# Patient Record
Sex: Male | Born: 1935 | Race: White | Hispanic: No | Marital: Married | Smoking: Former smoker
Health system: Southern US, Community
[De-identification: ages and names within clinical notes are randomized; demographics above are authoritative.]

## PROBLEM LIST (undated history)

## (undated) DIAGNOSIS — C61 Malignant neoplasm of prostate: Secondary | ICD-10-CM

## (undated) DIAGNOSIS — I10 Essential (primary) hypertension: Secondary | ICD-10-CM

## (undated) DIAGNOSIS — E46 Unspecified protein-calorie malnutrition: Secondary | ICD-10-CM

## (undated) DIAGNOSIS — G20 Parkinson's disease: Secondary | ICD-10-CM

## (undated) DIAGNOSIS — E785 Hyperlipidemia, unspecified: Secondary | ICD-10-CM

## (undated) DIAGNOSIS — N189 Chronic kidney disease, unspecified: Secondary | ICD-10-CM

## (undated) HISTORY — PX: AORTIC VALVE REPLACEMENT: SHX41

---

## 2012-08-09 ENCOUNTER — Other Ambulatory Visit: Payer: Self-pay | Admitting: Internal Medicine

## 2013-02-12 ENCOUNTER — Other Ambulatory Visit: Payer: Self-pay | Admitting: Internal Medicine

## 2013-02-28 ENCOUNTER — Ambulatory Visit (HOSPITAL_COMMUNITY)
Admission: AD | Admit: 2013-02-28 | Discharge: 2013-02-28 | Disposition: A | Discharge status: Home / Self Care | Payer: Medicare Other | Source: Other Acute Inpatient Hospital | Attending: Internal Medicine | Admitting: Internal Medicine

## 2013-02-28 ENCOUNTER — Inpatient Hospital Stay
Admission: AD | Admit: 2013-02-28 | Discharge: 2013-03-21 | Disposition: A | Discharge status: Skilled Nursing Facility | Payer: Medicare Other | Source: Ambulatory Visit | Attending: Internal Medicine | Admitting: Internal Medicine

## 2013-02-28 DIAGNOSIS — R0989 Other specified symptoms and signs involving the circulatory and respiratory systems: Secondary | ICD-10-CM | POA: Insufficient documentation

## 2013-02-28 DIAGNOSIS — R0609 Other forms of dyspnea: Secondary | ICD-10-CM | POA: Insufficient documentation

## 2013-02-28 DIAGNOSIS — R092 Respiratory arrest: Secondary | ICD-10-CM | POA: Insufficient documentation

## 2013-02-28 DIAGNOSIS — J189 Pneumonia, unspecified organism: Secondary | ICD-10-CM | POA: Insufficient documentation

## 2013-02-28 HISTORY — DX: Chronic kidney disease, unspecified: N18.9

## 2013-02-28 HISTORY — DX: Essential (primary) hypertension: I10

## 2013-02-28 HISTORY — DX: Hyperlipidemia, unspecified: E78.5

## 2013-02-28 HISTORY — DX: Parkinson's disease: G20

## 2013-02-28 HISTORY — DX: Unspecified protein-calorie malnutrition: E46

## 2013-02-28 HISTORY — DX: Malignant neoplasm of prostate: C61

## 2013-03-01 ENCOUNTER — Other Ambulatory Visit (HOSPITAL_COMMUNITY): Payer: Medicare Other

## 2013-03-01 LAB — CBC WITH DIFFERENTIAL/PLATELET
Basophils Absolute: 0 10*3/uL (ref 0.0–0.1)
Basophils Relative: 0 % (ref 0–1)
EOS ABS: 0 10*3/uL (ref 0.0–0.7)
EOS PCT: 0 % (ref 0–5)
HCT: 31.2 % — ABNORMAL LOW (ref 39.0–52.0)
HEMOGLOBIN: 10.7 g/dL — AB (ref 13.0–17.0)
LYMPHS ABS: 0.5 10*3/uL — AB (ref 0.7–4.0)
Lymphocytes Relative: 5 % — ABNORMAL LOW (ref 12–46)
MCH: 32.5 pg (ref 26.0–34.0)
MCHC: 34.3 g/dL (ref 30.0–36.0)
MCV: 94.8 fL (ref 78.0–100.0)
MONOS PCT: 4 % (ref 3–12)
Monocytes Absolute: 0.3 10*3/uL (ref 0.1–1.0)
Neutro Abs: 7.9 10*3/uL — ABNORMAL HIGH (ref 1.7–7.7)
Neutrophils Relative %: 91 % — ABNORMAL HIGH (ref 43–77)
Platelets: 108 10*3/uL — ABNORMAL LOW (ref 150–400)
RBC: 3.29 MIL/uL — AB (ref 4.22–5.81)
RDW: 13.8 % (ref 11.5–15.5)
WBC: 8.5 10*3/uL (ref 4.0–10.5)

## 2013-03-01 LAB — PREALBUMIN: Prealbumin: 33.6 mg/dL (ref 17.0–34.0)

## 2013-03-01 LAB — COMPREHENSIVE METABOLIC PANEL
ALT: <5 U/L (ref 0–53)
AST: 14 U/L (ref 0–37)
Albumin: 2.5 g/dL — ABNORMAL LOW (ref 3.5–5.2)
Alkaline Phosphatase: 104 U/L (ref 39–117)
BUN: 94 mg/dL — ABNORMAL HIGH (ref 6–23)
CALCIUM: 7.8 mg/dL — AB (ref 8.4–10.5)
CO2: 24 meq/L (ref 19–32)
Chloride: 106 mEq/L (ref 96–112)
Creatinine, Ser: 4.63 mg/dL — ABNORMAL HIGH (ref 0.50–1.35)
GFR, EST AFRICAN AMERICAN: 13 mL/min — AB (ref >90)
GFR, EST NON AFRICAN AMERICAN: 11 mL/min — AB (ref >90)
GLUCOSE: 108 mg/dL — AB (ref 70–99)
Potassium: 4.9 mEq/L (ref 3.7–5.3)
Sodium: 142 mEq/L (ref 137–147)
TOTAL PROTEIN: 5.9 g/dL — AB (ref 6.0–8.3)
Total Bilirubin: 0.4 mg/dL (ref 0.3–1.2)

## 2013-03-01 LAB — TSH: TSH: 4.037 u[IU]/mL (ref 0.350–4.500)

## 2013-03-01 LAB — PROCALCITONIN

## 2013-03-03 LAB — URINALYSIS, ROUTINE W REFLEX MICROSCOPIC
BILIRUBIN URINE: NEGATIVE
Glucose, UA: NEGATIVE mg/dL
KETONES UR: NEGATIVE mg/dL
LEUKOCYTES UA: NEGATIVE
NITRITE: NEGATIVE
PH: 5.5 (ref 5.0–8.0)
Protein, ur: NEGATIVE mg/dL
Specific Gravity, Urine: 1.013 (ref 1.005–1.030)
UROBILINOGEN UA: 0.2 mg/dL (ref 0.0–1.0)

## 2013-03-03 LAB — CBC
HCT: 30.4 % — ABNORMAL LOW (ref 39.0–52.0)
Hemoglobin: 10.3 g/dL — ABNORMAL LOW (ref 13.0–17.0)
MCH: 32 pg (ref 26.0–34.0)
MCHC: 33.9 g/dL (ref 30.0–36.0)
MCV: 94.4 fL (ref 78.0–100.0)
PLATELETS: 97 10*3/uL — AB (ref 150–400)
RBC: 3.22 MIL/uL — ABNORMAL LOW (ref 4.22–5.81)
RDW: 13.9 % (ref 11.5–15.5)
WBC: 8.4 10*3/uL (ref 4.0–10.5)

## 2013-03-03 LAB — BASIC METABOLIC PANEL
BUN: 92 mg/dL — ABNORMAL HIGH (ref 6–23)
CHLORIDE: 108 meq/L (ref 96–112)
CO2: 24 mEq/L (ref 19–32)
Calcium: 8.1 mg/dL — ABNORMAL LOW (ref 8.4–10.5)
Creatinine, Ser: 4.71 mg/dL — ABNORMAL HIGH (ref 0.50–1.35)
GFR calc Af Amer: 12 mL/min — ABNORMAL LOW (ref >90)
GFR, EST NON AFRICAN AMERICAN: 11 mL/min — AB (ref >90)
Glucose, Bld: 149 mg/dL — ABNORMAL HIGH (ref 70–99)
Potassium: 4.7 mEq/L (ref 3.7–5.3)
Sodium: 146 mEq/L (ref 137–147)

## 2013-03-03 LAB — HEPATITIS B SURFACE ANTIGEN: HEP B S AG: NEGATIVE

## 2013-03-03 LAB — HEPATITIS B SURFACE ANTIBODY,QUALITATIVE: Hep B S Ab: NEGATIVE

## 2013-03-03 LAB — URINE MICROSCOPIC-ADD ON

## 2013-03-03 LAB — HEPATITIS B CORE ANTIBODY, TOTAL: Hep B Core Total Ab: NONREACTIVE

## 2013-03-06 ENCOUNTER — Encounter: Payer: Self-pay | Admitting: Radiology

## 2013-03-06 LAB — BASIC METABOLIC PANEL
BUN: 67 mg/dL — ABNORMAL HIGH (ref 6–23)
CHLORIDE: 103 meq/L (ref 96–112)
CO2: 25 meq/L (ref 19–32)
CREATININE: 4.27 mg/dL — AB (ref 0.50–1.35)
Calcium: 8.7 mg/dL (ref 8.4–10.5)
GFR calc Af Amer: 14 mL/min — ABNORMAL LOW (ref >90)
GFR calc non Af Amer: 12 mL/min — ABNORMAL LOW (ref >90)
Glucose, Bld: 124 mg/dL — ABNORMAL HIGH (ref 70–99)
Potassium: 5.4 mEq/L — ABNORMAL HIGH (ref 3.7–5.3)
Sodium: 141 mEq/L (ref 137–147)

## 2013-03-06 LAB — CBC
HCT: 30 % — ABNORMAL LOW (ref 39.0–52.0)
Hemoglobin: 10.1 g/dL — ABNORMAL LOW (ref 13.0–17.0)
MCH: 32.2 pg (ref 26.0–34.0)
MCHC: 33.7 g/dL (ref 30.0–36.0)
MCV: 95.5 fL (ref 78.0–100.0)
PLATELETS: 82 10*3/uL — AB (ref 150–400)
RBC: 3.14 MIL/uL — ABNORMAL LOW (ref 4.22–5.81)
RDW: 13.9 % (ref 11.5–15.5)
WBC: 5.5 10*3/uL (ref 4.0–10.5)

## 2013-03-06 NOTE — Consult Note (Signed)
HPI: Howard Horton is an 80 y.o. 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 He has renal failure that has progressed to needing hemodialysis. He is currently getting HD via a right femoral temp cath on M-W-F. He needs to participate with PT more and his renal function is not improving much, so the Nephrology team has requested IR place a new tunneled HD catheter and remove his femoral catheter. PMHx and meds reviewed. Currently on HD, temp cath fxn well.  Past Medical History:  Past Medical History  Diagnosis Date  . Chronic renal failure   . Prostate cancer   . Parkinson's disease   . HTN (hypertension)   . Hyperlipidemia   . Protein-calorie malnutrition     Past Surgical History:  Past Surgical History  Procedure Laterality Date  . Aortic valve replacement      Family History: No family history on file.  Social History:  reports that he has quit smoking. He does not have any smokeless tobacco history on file. He reports that he does not drink alcohol or use illicit drugs.  Allergies: No Known Allergies  Medications: Metoprolol 25mg  bid, Sinemet 25/100 2 tabs q6h, Lipitor 20mg  qhs, Lexapro 10mg  daily, Detrol 2mg  BID, Fluticasone 61mcg 2 sprays daily, Breo Ellipta 1puff daily, Aricept 10mg  daily, Norvasc 2.5mg  daily, Heparin 5000u sub-q q8h, Vancomycin IV 1250mg  q48h.  Please HPI for pertinent positives, otherwise complete 10 system ROS negative.  Physical Exam: Temp: 97.2, HR: 73, RR: 16, BP: 116/68, O2: 97%   General Appearance:  Alert, cooperative, no distress, appears stated age  Head:  Normocephalic, without obvious abnormality, atraumatic  ENT: Unremarkable  Lungs:   Clear to auscultation bilaterally, no w/r/r  Chest Wall:  No tenderness or deformity  Heart:  Regular rate and rhythm, S1, S2 normal, no murmur, rub or gallop.  Abdomen:   Soft, non-tender, non distended.  Extremities:  Extremities normal, (L)femoral temp cath intact.  Pulses: 2+ and symmetric  Neurologic: Normal affect, no gross deficits.   Results for orders placed during the hospital encounter of 02/28/13 (from the past 48 hour(s))  CBC     Status: Abnormal   Collection Time    03/06/13  7:15 AM      Result Value Ref Range   WBC 5.5  4.0 - 10.5 K/uL   RBC 3.14 (*) 4.22 - 5.81 MIL/uL   Hemoglobin 10.1 (*) 13.0 - 17.0 g/dL   HCT 30.0 (*) 39.0 - 52.0 %   MCV 95.5  78.0 - 100.0 fL   MCH 32.2  26.0 - 34.0 pg   MCHC 33.7  30.0 - 36.0 g/dL   RDW 13.9  11.5 - 15.5 %   Platelets 82 (*) 150 - 400 K/uL   Comment: CONSISTENT WITH PREVIOUS RESULT  BASIC METABOLIC PANEL     Status: Abnormal   Collection Time    03/06/13  7:15 AM      Result Value Ref Range   Sodium 141  137 - 147 mEq/L   Potassium 5.4 (*) 3.7 - 5.3 mEq/L   Chloride 103  96 - 112 mEq/L   CO2 25  19 - 32 mEq/L   Glucose, Bld 124 (*) 70 - 99 mg/dL   BUN 67 (*) 6 - 23 mg/dL   Creatinine, Ser 4.27 (*) 0.50 - 1.35 mg/dL   Calcium 8.7  8.4 - 10.5 mg/dL   GFR calc non Af Amer 12 (*) >  90 mL/min   GFR calc Af Amer 14 (*) >90 mL/min   Comment: (NOTE)     The eGFR has been calculated using the CKD EPI equation.     This calculation has not been validated in all clinical situations.     eGFR's persistently <90 mL/min signify possible Chronic Kidney     Disease.   No results found.  Assessment/Plan Protracted renal failure requiring prolonged HD. Needs tunneled HD catheter. Discussed with pt at length, risks, complications, use of sedation. Labs reviewed, PLTs slowly trend down, poss related to sub-q heparin?? Will recheck labs in am. Consent signed in chart  Ascencion Dike PA-C 03/06/2013, 3:48 PM

## 2013-03-07 ENCOUNTER — Other Ambulatory Visit (HOSPITAL_COMMUNITY): Payer: Medicare Other

## 2013-03-07 LAB — CBC
HEMATOCRIT: 31.1 % — AB (ref 39.0–52.0)
HEMOGLOBIN: 10.6 g/dL — AB (ref 13.0–17.0)
MCH: 32.2 pg (ref 26.0–34.0)
MCHC: 34.1 g/dL (ref 30.0–36.0)
MCV: 94.5 fL (ref 78.0–100.0)
Platelets: 92 10*3/uL — ABNORMAL LOW (ref 150–400)
RBC: 3.29 MIL/uL — AB (ref 4.22–5.81)
RDW: 13.5 % (ref 11.5–15.5)
WBC: 5.3 10*3/uL (ref 4.0–10.5)

## 2013-03-07 LAB — BASIC METABOLIC PANEL
BUN: 39 mg/dL — ABNORMAL HIGH (ref 6–23)
CHLORIDE: 101 meq/L (ref 96–112)
CO2: 22 mEq/L (ref 19–32)
Calcium: 8.7 mg/dL (ref 8.4–10.5)
Creatinine, Ser: 2.99 mg/dL — ABNORMAL HIGH (ref 0.50–1.35)
GFR calc Af Amer: 22 mL/min — ABNORMAL LOW (ref >90)
GFR calc non Af Amer: 19 mL/min — ABNORMAL LOW (ref >90)
Glucose, Bld: 103 mg/dL — ABNORMAL HIGH (ref 70–99)
Potassium: 4.7 mEq/L (ref 3.7–5.3)
SODIUM: 139 meq/L (ref 137–147)

## 2013-03-07 LAB — PROTIME-INR
INR: 1.02 (ref 0.00–1.49)
Prothrombin Time: 13.2 seconds (ref 11.6–15.2)

## 2013-03-07 LAB — APTT: aPTT: 38 seconds — ABNORMAL HIGH (ref 24–37)

## 2013-03-07 MED ORDER — FENTANYL CITRATE 0.05 MG/ML IJ SOLN
INTRAMUSCULAR | Status: AC | PRN
  Administered 2013-03-07: 50 ug via INTRAVENOUS

## 2013-03-07 MED ORDER — MIDAZOLAM HCL 2 MG/2ML IJ SOLN
INTRAMUSCULAR | Status: AC
  Filled 2013-03-07: qty 4

## 2013-03-07 MED ORDER — CEFAZOLIN SODIUM-DEXTROSE 2-3 GM-% IV SOLR
2.0000 g | Freq: Once | INTRAVENOUS | Status: AC
  Administered 2013-03-07: 2 g via INTRAVENOUS

## 2013-03-07 MED ORDER — MIDAZOLAM HCL 2 MG/2ML IJ SOLN
INTRAMUSCULAR | Status: AC | PRN
  Administered 2013-03-07: 1 mg via INTRAVENOUS

## 2013-03-07 MED ORDER — HEPARIN SODIUM (PORCINE) 1000 UNIT/ML IJ SOLN
INTRAMUSCULAR | Status: AC
  Filled 2013-03-07: qty 1

## 2013-03-07 MED ORDER — FENTANYL CITRATE 0.05 MG/ML IJ SOLN
INTRAMUSCULAR | Status: AC
Start: 2013-03-07 — End: 2013-03-07
  Filled 2013-03-07: qty 4

## 2013-03-07 NOTE — Procedures (Signed)
Placement of right jugular Perm Cath.  Tip at cavoatrial junction and ready to use.  No immediate complication.

## 2013-03-08 LAB — RENAL FUNCTION PANEL
ALBUMIN: 2.7 g/dL — AB (ref 3.5–5.2)
BUN: 59 mg/dL — ABNORMAL HIGH (ref 6–23)
CO2: 22 mEq/L (ref 19–32)
Calcium: 8.6 mg/dL (ref 8.4–10.5)
Chloride: 104 mEq/L (ref 96–112)
Creatinine, Ser: 4.18 mg/dL — ABNORMAL HIGH (ref 0.50–1.35)
GFR calc Af Amer: 14 mL/min — ABNORMAL LOW (ref >90)
GFR calc non Af Amer: 12 mL/min — ABNORMAL LOW (ref >90)
Glucose, Bld: 131 mg/dL — ABNORMAL HIGH (ref 70–99)
PHOSPHORUS: 4.8 mg/dL — AB (ref 2.3–4.6)
POTASSIUM: 4.4 meq/L (ref 3.7–5.3)
SODIUM: 143 meq/L (ref 137–147)

## 2013-03-08 LAB — CBC
HCT: 30.9 % — ABNORMAL LOW (ref 39.0–52.0)
Hemoglobin: 10.6 g/dL — ABNORMAL LOW (ref 13.0–17.0)
MCH: 32.3 pg (ref 26.0–34.0)
MCHC: 34.3 g/dL (ref 30.0–36.0)
MCV: 94.2 fL (ref 78.0–100.0)
Platelets: 83 10*3/uL — ABNORMAL LOW (ref 150–400)
RBC: 3.28 MIL/uL — ABNORMAL LOW (ref 4.22–5.81)
RDW: 13.5 % (ref 11.5–15.5)
WBC: 4.3 10*3/uL (ref 4.0–10.5)

## 2013-03-10 LAB — RENAL FUNCTION PANEL
Albumin: 2.6 g/dL — ABNORMAL LOW (ref 3.5–5.2)
BUN: 54 mg/dL — ABNORMAL HIGH (ref 6–23)
CALCIUM: 8.3 mg/dL — AB (ref 8.4–10.5)
CO2: 23 mEq/L (ref 19–32)
CREATININE: 3.57 mg/dL — AB (ref 0.50–1.35)
Chloride: 103 mEq/L (ref 96–112)
GFR calc non Af Amer: 15 mL/min — ABNORMAL LOW (ref >90)
GFR, EST AFRICAN AMERICAN: 17 mL/min — AB (ref >90)
GLUCOSE: 128 mg/dL — AB (ref 70–99)
PHOSPHORUS: 4.7 mg/dL — AB (ref 2.3–4.6)
Potassium: 3.9 mEq/L (ref 3.7–5.3)
SODIUM: 141 meq/L (ref 137–147)

## 2013-03-10 LAB — CBC
HEMATOCRIT: 28.8 % — AB (ref 39.0–52.0)
Hemoglobin: 10 g/dL — ABNORMAL LOW (ref 13.0–17.0)
MCH: 32.5 pg (ref 26.0–34.0)
MCHC: 34.7 g/dL (ref 30.0–36.0)
MCV: 93.5 fL (ref 78.0–100.0)
Platelets: 94 10*3/uL — ABNORMAL LOW (ref 150–400)
RBC: 3.08 MIL/uL — ABNORMAL LOW (ref 4.22–5.81)
RDW: 13.4 % (ref 11.5–15.5)
WBC: 3.7 10*3/uL — ABNORMAL LOW (ref 4.0–10.5)

## 2013-03-13 LAB — CBC
HCT: 27.2 % — ABNORMAL LOW (ref 39.0–52.0)
Hemoglobin: 9.3 g/dL — ABNORMAL LOW (ref 13.0–17.0)
MCH: 31.7 pg (ref 26.0–34.0)
MCHC: 34.2 g/dL (ref 30.0–36.0)
MCV: 92.8 fL (ref 78.0–100.0)
PLATELETS: 131 10*3/uL — AB (ref 150–400)
RBC: 2.93 MIL/uL — ABNORMAL LOW (ref 4.22–5.81)
RDW: 13.2 % (ref 11.5–15.5)
WBC: 3.5 10*3/uL — AB (ref 4.0–10.5)

## 2013-03-13 LAB — RENAL FUNCTION PANEL
ALBUMIN: 2.7 g/dL — AB (ref 3.5–5.2)
BUN: 71 mg/dL — AB (ref 6–23)
CALCIUM: 8.4 mg/dL (ref 8.4–10.5)
CO2: 22 mEq/L (ref 19–32)
CREATININE: 3.69 mg/dL — AB (ref 0.50–1.35)
Chloride: 104 mEq/L (ref 96–112)
GFR calc Af Amer: 17 mL/min — ABNORMAL LOW (ref >90)
GFR calc non Af Amer: 14 mL/min — ABNORMAL LOW (ref >90)
Glucose, Bld: 116 mg/dL — ABNORMAL HIGH (ref 70–99)
PHOSPHORUS: 4.6 mg/dL (ref 2.3–4.6)
Potassium: 4 mEq/L (ref 3.7–5.3)
Sodium: 142 mEq/L (ref 137–147)

## 2013-03-13 LAB — MAGNESIUM: Magnesium: 2.2 mg/dL (ref 1.5–2.5)

## 2013-03-14 LAB — BASIC METABOLIC PANEL
BUN: 77 mg/dL — ABNORMAL HIGH (ref 6–23)
CO2: 22 mEq/L (ref 19–32)
Calcium: 8.6 mg/dL (ref 8.4–10.5)
Chloride: 105 mEq/L (ref 96–112)
Creatinine, Ser: 3.92 mg/dL — ABNORMAL HIGH (ref 0.50–1.35)
GFR calc Af Amer: 16 mL/min — ABNORMAL LOW (ref >90)
GFR, EST NON AFRICAN AMERICAN: 13 mL/min — AB (ref >90)
Glucose, Bld: 111 mg/dL — ABNORMAL HIGH (ref 70–99)
POTASSIUM: 4.3 meq/L (ref 3.7–5.3)
SODIUM: 143 meq/L (ref 137–147)

## 2013-03-14 NOTE — Progress Notes (Signed)
[facility]                                                                                                   Progress Note      Patient Demographics  Howard Horton, is a 80 y.o. male  [id number]  [id number]  DOB - December 13, 1932  Admit date - 02/28/2013  Admitting Physician Merton Border, MD  Outpatient Primary MD for the patient is No PCP Per Patient  LOS - 14   No chief complaint on file.          Subjective:   Howard Horton today has, no headache, no chest pain or shortness of breath, no minimal pain, no nausea vomiting or diarrhea. No focal weakness.  Objective:   Vitals   BP 114/77, pulse 77, respiratory rate 20, temperature 97.9, pulse ox 100% on 1 L nasal cannula oxygen   Exam  Awake Alert, Oriented X 3, No new F.N deficits, Normal affect [place].AT,PERRAL Supple Neck,No JVD, No cervical lymphadenopathy appriciated.  Symmetrical Chest wall movement, Good air movement bilaterally, decreased basilar breath sounds RRR,No Gallops,Rubs or new Murmurs, No Parasternal Heave +ve B.Sounds, Abd Soft, Non tender, No organomegaly appriciated, No rebound - guarding or rigidity. No Cyanosis, Clubbing or edema, No new Rash or bruise      I&Os  Weight  Peg Tube/NG/OG -  Foley -   Trach/ETT -        Data Review   CBC  Recent Labs Lab 03/08/13 0605 03/10/13 0340 03/13/13 0420  WBC 4.3 3.7* 3.5*  HGB 10.6* 10.0* 9.3*  HCT 30.9* 28.8* 27.2*  PLT 83* 94* 131*  MCV 94.2 93.5 92.8  MCH 32.3 32.5 31.7  MCHC 34.3 34.7 34.2  RDW 13.5 13.4 13.2    Chemistries   Recent Labs Lab 03/08/13 0605 03/10/13 0340 03/13/13 0420 03/14/13 0527  NA 143 141 142 143  K 4.4 3.9 4.0 4.3  CL 104 103 104 105  CO2 22 23 22 22   GLUCOSE 131* 128* 116* 111*  BUN 59* 54* 71* 77*  CREATININE 4.18* 3.57* 3.69* 3.92*  CALCIUM 8.6 8.3* 8.4 8.6  MG  --   --  2.2  --     ------------------------------------------------------------------------------------------------------------------ CrCl is unknown because there is no height on file for the current visit. ------------------------------------------------------------------------------------------------------------------ No results found for this basename: HGBA1C,  in the last 72 hours ------------------------------------------------------------------------------------------------------------------ No results found for this basename: CHOL, HDL, LDLCALC, TRIG, CHOLHDL, LDLDIRECT,  in the last 72 hours ------------------------------------------------------------------------------------------------------------------ No results found for this basename: TSH, T4TOTAL, FREET3, T3FREE, THYROIDAB,  in the last 72 hours ------------------------------------------------------------------------------------------------------------------ No results found for this basename: VITAMINB12, FOLATE, FERRITIN, TIBC, IRON, RETICCTPCT,  in the last 72 hours  Coagulation profile No results found for this basename: INR, PROTIME,  in the last 168 hours  No results found for this basename: DDIMER,  in the last 72 hours  Cardiac Enzymes No results found for this basename: CK, CKMB, TROPONINI, MYOGLOBIN,  in the last 168 hours ------------------------------------------------------------------------------------------------------------------ No components found with this basename: POCBNP,   Micro Results No results  found for this or any previous visit (from the past 240 hour(s)).     Assessment & Plan    1. Acute respiratory failure. Much improved, on 1 L nasal cannula oxygen, CPAP at night.   2. COPD. Stable, continue nebulizer treatments.   3. AKI - off hemodialysis, patient refusing further dialysis.   4. Pneumonia. DC cefazolin today   5. History of prostate cancer with metastases to the lungs. Outpatient followup  after discharge.   6. Parkinson's disease. Continue Sinemet.   7. Hypertension. Continue with Norvasc and metoprolol.   8. Generalized weakness. Continue PTOT   9. Protein calorie malnutrition. Continue with renal diet orally.   10. Anxiety. Continue SSRI   Code Status: DO NOT RESUSCITATE  Family Communication: None present  Disposition Plan: Discharge home    Time Spent in minutes   35   DVT Prophylaxis  heparin   Merton Border M.D on 03/14/2013 at 3:34 PM

## 2013-03-15 LAB — BASIC METABOLIC PANEL
BUN: 77 mg/dL — AB (ref 6–23)
CO2: 23 mEq/L (ref 19–32)
Calcium: 8.5 mg/dL (ref 8.4–10.5)
Chloride: 105 mEq/L (ref 96–112)
Creatinine, Ser: 3.63 mg/dL — ABNORMAL HIGH (ref 0.50–1.35)
GFR calc non Af Amer: 15 mL/min — ABNORMAL LOW (ref >90)
GFR, EST AFRICAN AMERICAN: 17 mL/min — AB (ref >90)
Glucose, Bld: 109 mg/dL — ABNORMAL HIGH (ref 70–99)
POTASSIUM: 4.3 meq/L (ref 3.7–5.3)
SODIUM: 143 meq/L (ref 137–147)

## 2013-03-16 LAB — BASIC METABOLIC PANEL
BUN: 81 mg/dL — ABNORMAL HIGH (ref 6–23)
CHLORIDE: 106 meq/L (ref 96–112)
CO2: 21 mEq/L (ref 19–32)
CREATININE: 3.82 mg/dL — AB (ref 0.50–1.35)
Calcium: 8.7 mg/dL (ref 8.4–10.5)
GFR calc non Af Amer: 14 mL/min — ABNORMAL LOW (ref >90)
GFR, EST AFRICAN AMERICAN: 16 mL/min — AB (ref >90)
Glucose, Bld: 115 mg/dL — ABNORMAL HIGH (ref 70–99)
POTASSIUM: 4.3 meq/L (ref 3.7–5.3)
SODIUM: 144 meq/L (ref 137–147)

## 2013-03-16 NOTE — Progress Notes (Signed)
[facility]                                                                                                   Progress Note      Patient Demographics  Howard Horton, is a 80 y.o. male  [id number]  [id number]  DOB - 1932-12-05  Admit date - 02/28/2013  Admitting Physician Merton Border, MD  Outpatient Primary MD for the patient is No PCP Per Patient  LOS - 16   No chief complaint on file.          Subjective:   Howard Horton today has, no headache, no chest pain or shortness of breath, no minimal pain, no nausea vomiting or diarrhea. No focal weakness. Complains of pain in his feet due to to gout  Objective:   Vitals   BP 116/73, pulse 88, respiratory rate 20, temperature 98.7, pulse ox 100% on 1 L nasal cannula oxygen   Exam  Awake Alert, Oriented X 3, No new F.N deficits, Normal affect [place].AT,PERRAL Supple Neck,No JVD, No cervical lymphadenopathy appriciated.  Symmetrical Chest wall movement, Good air movement bilaterally, decreased basilar breath sounds RRR,No Gallops,Rubs or new Murmurs, No Parasternal Heave +ve B.Sounds, Abd Soft, Non tender, No organomegaly appriciated, No rebound - guarding or rigidity. No Cyanosis, Clubbing or edema, No new Rash or bruise      I&Os 400/350   Data Review   CBC  Recent Labs Lab 03/10/13 0340 03/13/13 0420  WBC 3.7* 3.5*  HGB 10.0* 9.3*  HCT 28.8* 27.2*  PLT 94* 131*  MCV 93.5 92.8  MCH 32.5 31.7  MCHC 34.7 34.2  RDW 13.4 13.2    Chemistries   Recent Labs Lab 03/10/13 0340 03/13/13 0420 03/14/13 0527 03/15/13 0652 03/16/13 0540  NA 141 142 143 143 144  K 3.9 4.0 4.3 4.3 4.3  CL 103 104 105 105 106  CO2 23 22 22 23 21   GLUCOSE 128* 116* 111* 109* 115*  BUN 54* 71* 77* 77* 81*  CREATININE 3.57* 3.69* 3.92* 3.63* 3.82*  CALCIUM 8.3* 8.4 8.6 8.5 8.7  MG  --  2.2  --   --   --     Cardiac Enzymes No  results found for this basename: CK, CKMB, TROPONINI, MYOGLOBIN,  in the last 168 hours ------------------------------------------------------------------------------------------------------------------ No components found with this basename: POCBNP,   Micro Results No results found for this or any previous visit (from the past 240 hour(s)).     Assessment & Plan    1. Acute respiratory failure. Much improved, on 1 L nasal cannula oxygen, CPAP at night.   2. COPD. Stable, continue nebulizer treatments.   3. AKI - wants dialysis to be resumed   4. Pneumonia. Off antibiotics   5. History of prostate cancer with metastases to the lungs. Outpatient followup after discharge.   6. Parkinson's disease. Continue Sinemet.   7. Hypertension. Continue with Norvasc and metoprolol.   8. Generalized weakness. Continue PTOT   9. Protein calorie malnutrition. Continue with renal diet orally.   10. Anxiety. Continue SSRI  11. Gout;  start home medications when available   Code Status: DO NOT RESUSCITATE  Family Communication: None present    DVT Prophylaxis  heparin   Merton Border M.D on 03/16/2013 at 3:47 PM

## 2013-03-17 LAB — BASIC METABOLIC PANEL
BUN: 75 mg/dL — ABNORMAL HIGH (ref 6–23)
CALCIUM: 8.8 mg/dL (ref 8.4–10.5)
CO2: 21 mEq/L (ref 19–32)
Chloride: 103 mEq/L (ref 96–112)
Creatinine, Ser: 3.48 mg/dL — ABNORMAL HIGH (ref 0.50–1.35)
GFR calc Af Amer: 18 mL/min — ABNORMAL LOW (ref >90)
GFR calc non Af Amer: 15 mL/min — ABNORMAL LOW (ref >90)
Glucose, Bld: 110 mg/dL — ABNORMAL HIGH (ref 70–99)
Potassium: 4.2 mEq/L (ref 3.7–5.3)
SODIUM: 142 meq/L (ref 137–147)

## 2013-03-17 LAB — CREATININE CLEARANCE, URINE, 24 HOUR
CREATININE 24H UR: 673 mg/d — AB (ref 800–2000)
Collection Interval-CRCL: 24 hours
Creatinine Clearance: 13 mL/min — ABNORMAL LOW (ref 75–125)
Creatinine, Urine: 61.17 mg/dL
Creatinine: 3.48 mg/dL — ABNORMAL HIGH (ref 0.50–1.35)
URINE TOTAL VOLUME-CRCL: 1100 mL

## 2013-03-17 NOTE — Progress Notes (Signed)
[facility]                                                                                                   Progress Note      Patient Demographics  Howard Horton, is a 80 y.o. male  [id number]  [id number]  DOB - January 08, 1933  Admit date - 02/28/2013  Admitting Physician Merton Border, MD  Outpatient Primary MD for the patient is No PCP Per Patient  LOS - 17  CC : PNA         Renal Failure         Prostate cancer         Subjective:   [name] today has, no headache, no chest pain or shortness of breath, no minimal pain, no nausea vomiting or diarrhea. No focal weakness. His gout pain decreased  Objective:   Vitals   BP 124/64, pulse 78, respiratory rate 20, temperature 97 6, pulse ox 100% on 1 L nasal cannula oxygen   Exam  Awake Alert, Oriented X 3, No new F.N deficits, Normal affect .AT,PERRAL Supple Neck,No JVD, No cervical lymphadenopathy appriciated.  Symmetrical Chest wall movement, Good air movement bilaterally, decreased basilar breath sounds RRR,No Gallops,Rubs or new Murmurs, No Parasternal Heave +ve B.Sounds, Abd Soft, Non tender, No organomegaly appriciated, No rebound - guarding or rigidity. No Cyanosis, Clubbing or edema, No new Rash or bruise      I&Os 1460/1580   Data Review   CBC  Recent Labs Lab 03/13/13 0420  WBC 3.5*  HGB 9.3*  HCT 27.2*  PLT 131*  MCV 92.8  MCH 31.7  MCHC 34.2  RDW 13.2    Chemistries   Recent Labs Lab 03/13/13 0420 03/14/13 0527 03/15/13 0652 03/16/13 0540 03/17/13 0626  NA 142 143 143 144 142  K 4.0 4.3 4.3 4.3 4.2  CL 104 105 105 106 103  CO2 22 22 23 21 21   GLUCOSE 116* 111* 109* 115* 110*  BUN 71* 77* 77* 81* 75*  CREATININE 3.69* 3.92* 3.63* 3.82* 3.48*  CALCIUM 8.4 8.6 8.5 8.7 8.8  MG 2.2  --   --   --   --         Assessment & Plan   1. Acute respiratory failure. Much  improved, on 1 L nasal cannula oxygen, CPAP at night.  2. COPD. Stable, continue nebulizer treatments.  3. AKI -his creatinine is improving will check on Monday . Discussed with nephrology  4. Pneumonia. Off antibiotics  5. History of prostate cancer with metastases to the lungs. Outpatient followup after discharge.  6. Parkinson's disease. Continue Sinemet.  7. Hypertension. Continue with Norvasc and metoprolol.  8. Generalized weakness. Continue PTOT  9. Protein calorie malnutrition. Continue with renal diet orally.  10. Anxiety. Continue SSRI  11. Gout; just started him on allopurinol  Code Status: DO NOT RESUSCITATE  Family Communication: None present    DVT Prophylaxis  heparin   Merton Border M.D on 03/17/2013 at 3:46 PM

## 2013-03-18 LAB — CBC
HEMATOCRIT: 28.6 % — AB (ref 39.0–52.0)
HEMOGLOBIN: 9.8 g/dL — AB (ref 13.0–17.0)
MCH: 32.2 pg (ref 26.0–34.0)
MCHC: 34.3 g/dL (ref 30.0–36.0)
MCV: 94.1 fL (ref 78.0–100.0)
Platelets: 198 10*3/uL (ref 150–400)
RBC: 3.04 MIL/uL — ABNORMAL LOW (ref 4.22–5.81)
RDW: 13.6 % (ref 11.5–15.5)
WBC: 6.3 10*3/uL (ref 4.0–10.5)

## 2013-03-18 NOTE — Progress Notes (Signed)
[facility]                                                                                                   Progress Note      Patient Demographics  Howard Horton, is a 80 y.o. male  [id number]:[id number]  [id number]  DOB - 12-Dec-1932  Admit date - 02/28/2013  Admitting Physician Merton Border, MD  Outpatient Primary MD for the patient is No PCP Per Patient  LOS - 18  CC : PNA         Renal Failure         Prostate cancer         Subjective:   Howard Horton today has, no headache, no chest pain or shortness of breath, no minimal pain, no nausea vomiting or diarrhea. No focal weakness.  He mentioned to the nurse today, that he is very depressed since his wife is not going to be taking care of him. He decided he might give up after he leaves here.  Objective:   Vitals   BP 140/70, pulse 74 respiratory rate 16 , temperature 97.6 pulse ox 95% on 1 L nasal cannula oxygen   Exam  Awake Alert, Oriented X 3, No new F.N deficits, Normal affect [place].AT,PERRAL Supple Neck,No JVD, No cervical lymphadenopathy appriciated.  Symmetrical Chest wall movement, Good air movement bilaterally, decreased basilar breath sounds RRR,No Gallops,Rubs or new Murmurs, No Parasternal Heave +ve B.Sounds, Abd Soft, Non tender, No organomegaly appriciated, No rebound - guarding or rigidity. No Cyanosis, Clubbing or edema, No new Rash or bruise      I&Os 1560/1550   Data Review   CBC  Recent Labs Lab 03/13/13 0420 03/18/13 0650  WBC 3.5* 6.3  HGB 9.3* 9.8*  HCT 27.2* 28.6*  PLT 131* 198  MCV 92.8 94.1  MCH 31.7 32.2  MCHC 34.2 34.3  RDW 13.2 13.6    Chemistries   Recent Labs Lab 03/13/13 0420 03/14/13 0527 03/15/13 0652 03/16/13 0540 03/16/13 1445 03/17/13 0626  NA 142 143 143 144  --  142  K 4.0 4.3 4.3 4.3  --  4.2  CL 104 105 105 106  --  103  CO2 22 22 23 21   --  21  GLUCOSE 116*  111* 109* 115*  --  110*  BUN 71* 77* 77* 81*  --  75*  CREATININE 3.69* 3.92* 3.63* 3.82* 3.48* 3.48*  CALCIUM 8.4 8.6 8.5 8.7  --  8.8  MG 2.2  --   --   --   --   --         Assessment & Plan   1. Acute respiratory failure. Much improved, on 1 L nasal cannula oxygen, CPAP at night.  2. COPD. Stable, continue nebulizer treatments.  3. AKI -his creatinine is improving will check on Monday . Discussed with nephrology, the patient might be taken off dialysis   4. Pneumonia. Off antibiotics  5. History of prostate cancer with metastases to the lungs. Outpatient followup after discharge.  6. Parkinson's disease. Continue Sinemet.  7. Hypertension. Continue  with Norvasc and metoprolol.  8. Generalized weakness. Continue PTOT  9. Protein calorie malnutrition. Continue with renal diet orally.  10. Anxiety/depression, seems to be situational. Increase Lexapro to 20 mg by mouth daily  11. History of Gout; allopurinol  Code Status: DO NOT RESUSCITATE  Family Communication: None present    DVT Prophylaxis  heparin   Merton Border M.D on 03/18/2013 at 2:58 PM

## 2013-03-19 NOTE — Progress Notes (Signed)
[facility]                                                                                                   Progress Note      Patient Demographics  Howard Horton, is a 80 y.o. male  [id number]  [id number]  DOB - January 31, 1932  Admit date - 02/28/2013  Admitting Physician Merton Border, MD  Outpatient Primary MD for the patient is No PCP Per Patient  LOS - 19  CC : PNA         Renal Failure         Prostate cancer         Subjective:   [name] today has, no headache, no chest pain or shortness of breath, no minimal pain, no nausea vomiting or diarrhea. No focal weakness.  He mentioned to the nurse today, that he is very depressed since his wife is not going to be taking care of him. He decided he might give up after he leaves here.  Objective:   Vitals   BP 126/64, pulse 76 respiratory rate 20 , temperature 97.0 pulse ox 95% on 1 L nasal cannula oxygen   Exam  Awake Alert, Oriented X 3, No new F.N deficits, Normal affect [place].AT,PERRAL Supple Neck,No JVD, No cervical lymphadenopathy appriciated.  Symmetrical Chest wall movement, Good air movement bilaterally, decreased basilar breath sounds RRR,No Gallops,Rubs or new Murmurs, No Parasternal Heave +ve B.Sounds, Abd Soft, Non tender, No organomegaly appriciated, No rebound - guarding or rigidity. No Cyanosis, Clubbing or edema, No new Rash or bruise      I&Os 1720/1200   Data Review   CBC  Recent Labs Lab 03/13/13 0420 03/18/13 0650  WBC 3.5* 6.3  HGB 9.3* 9.8*  HCT 27.2* 28.6*  PLT 131* 198  MCV 92.8 94.1  MCH 31.7 32.2  MCHC 34.2 34.3  RDW 13.2 13.6    Chemistries   Recent Labs Lab 03/13/13 0420 03/14/13 0527 03/15/13 0652 03/16/13 0540 03/16/13 1445 03/17/13 0626  NA 142 143 143 144  --  142  K 4.0 4.3 4.3 4.3  --  4.2  CL 104 105 105 106  --  103  CO2 22 22 23 21   --  21  GLUCOSE 116*  111* 109* 115*  --  110*  BUN 71* 77* 77* 81*  --  75*  CREATININE 3.69* 3.92* 3.63* 3.82* 3.48* 3.48*  CALCIUM 8.4 8.6 8.5 8.7  --  8.8  MG 2.2  --   --   --   --   --         Assessment & Plan   1. Acute respiratory failure. Much improved, on 1 L nasal cannula oxygen, CPAP at night.  2. COPD. Stable, continue nebulizer treatments.  3. AKI -his creatinine is improving will check on Monday . Discussed with nephrology, the patient might be taken off dialysis if he continues improvement  4. Pneumonia. Off antibiotics  5. History of prostate cancer with metastases to the lungs. Outpatient followup after discharge.  6. Parkinson's disease. Continue Sinemet.  7. Hypertension. Continue with Norvasc and metoprolol.  8. Generalized weakness. Continue PT/OT  9. Protein calorie malnutrition. Continue with renal diet .  10. Anxiety/depression, seems to be situational. Increase Lexapro to 20 mg by mouth daily  11. History of Gout; allopurinol  Code Status: DO NOT RESUSCITATE  Family Communication: Discussed with daughter at bedside  The patient's situation was discussed with the Education officer, museum. I'm really concerned about wife's attitude. I talked to the daughter  today and asked her to discuss the situation with the social worker. The wife's attitude and communication seem disturbing. I'm not sure if the patient should go home with her. I did not communicate this to the daughter yet, but there should be a meeting with the family concerning his discharge and needs.    DVT Prophylaxis  heparin   Merton Border M.D on 03/19/2013 at 3:17 PM

## 2013-03-20 LAB — BASIC METABOLIC PANEL
BUN: 71 mg/dL — ABNORMAL HIGH (ref 6–23)
CALCIUM: 8.9 mg/dL (ref 8.4–10.5)
CO2: 24 mEq/L (ref 19–32)
CREATININE: 3.44 mg/dL — AB (ref 0.50–1.35)
Chloride: 105 mEq/L (ref 96–112)
GFR calc non Af Amer: 16 mL/min — ABNORMAL LOW (ref >90)
GFR, EST AFRICAN AMERICAN: 18 mL/min — AB (ref >90)
Glucose, Bld: 106 mg/dL — ABNORMAL HIGH (ref 70–99)
Potassium: 4.2 mEq/L (ref 3.7–5.3)
Sodium: 143 mEq/L (ref 137–147)

## 2013-03-20 LAB — CBC
HCT: 27.2 % — ABNORMAL LOW (ref 39.0–52.0)
Hemoglobin: 9.4 g/dL — ABNORMAL LOW (ref 13.0–17.0)
MCH: 32.3 pg (ref 26.0–34.0)
MCHC: 34.6 g/dL (ref 30.0–36.0)
MCV: 93.5 fL (ref 78.0–100.0)
PLATELETS: 168 10*3/uL (ref 150–400)
RBC: 2.91 MIL/uL — ABNORMAL LOW (ref 4.22–5.81)
RDW: 13.6 % (ref 11.5–15.5)
WBC: 5.8 10*3/uL (ref 4.0–10.5)

## 2013-04-08 ENCOUNTER — Other Ambulatory Visit: Payer: Self-pay | Admitting: Emergency Medicine

## 2013-04-12 DEATH — deceased

## 2015-06-09 IMAGING — CR DG CHEST 2V
1 series · 3 of 3 positions shown · non-contrast
Comparison: PET-CT 10/11/2012

CLINICAL DATA: Shortness of breath, hypoxia ; history of prior
PET-CT indicates history of stage IV prostate cancer

EXAM:
CHEST  2 VIEW

[Series 1: ap · 0.17mm/px · 3 of 3 slices shown]
[im 1/3]
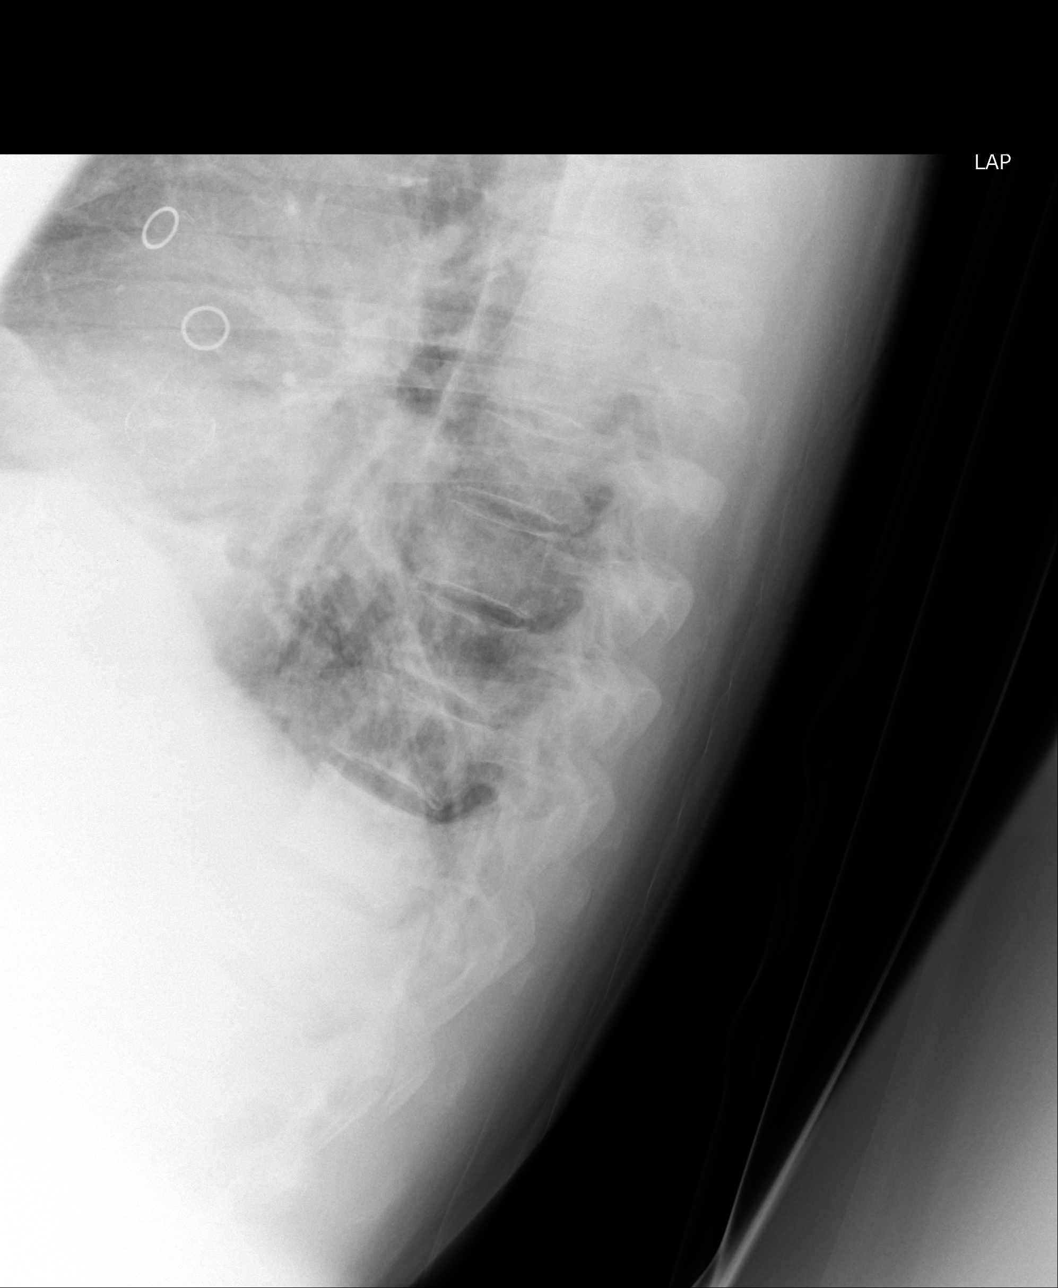
[im 2/3]
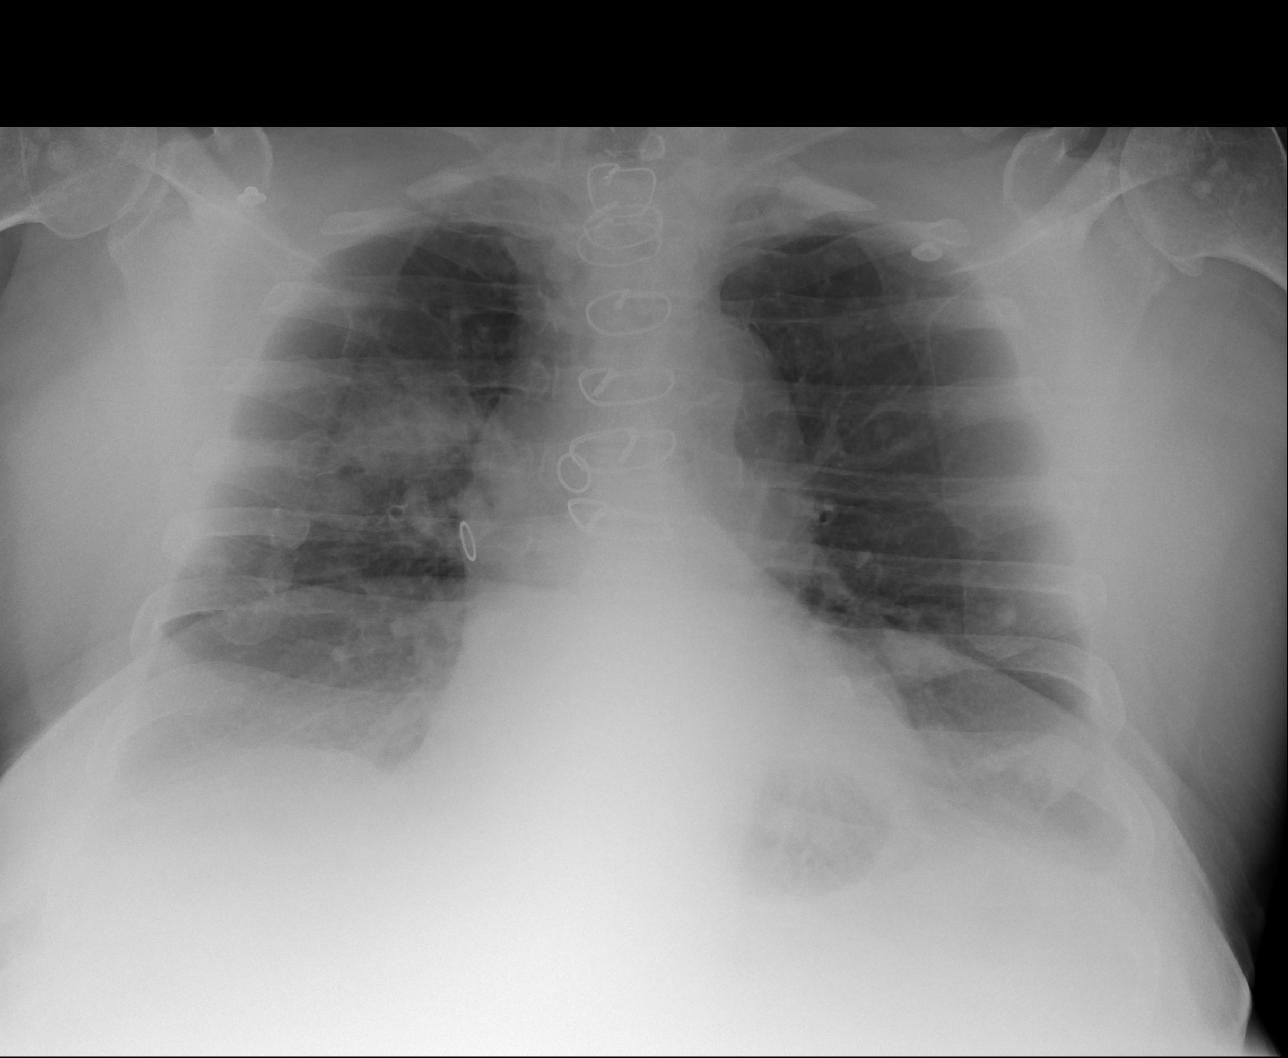
[im 3/3]
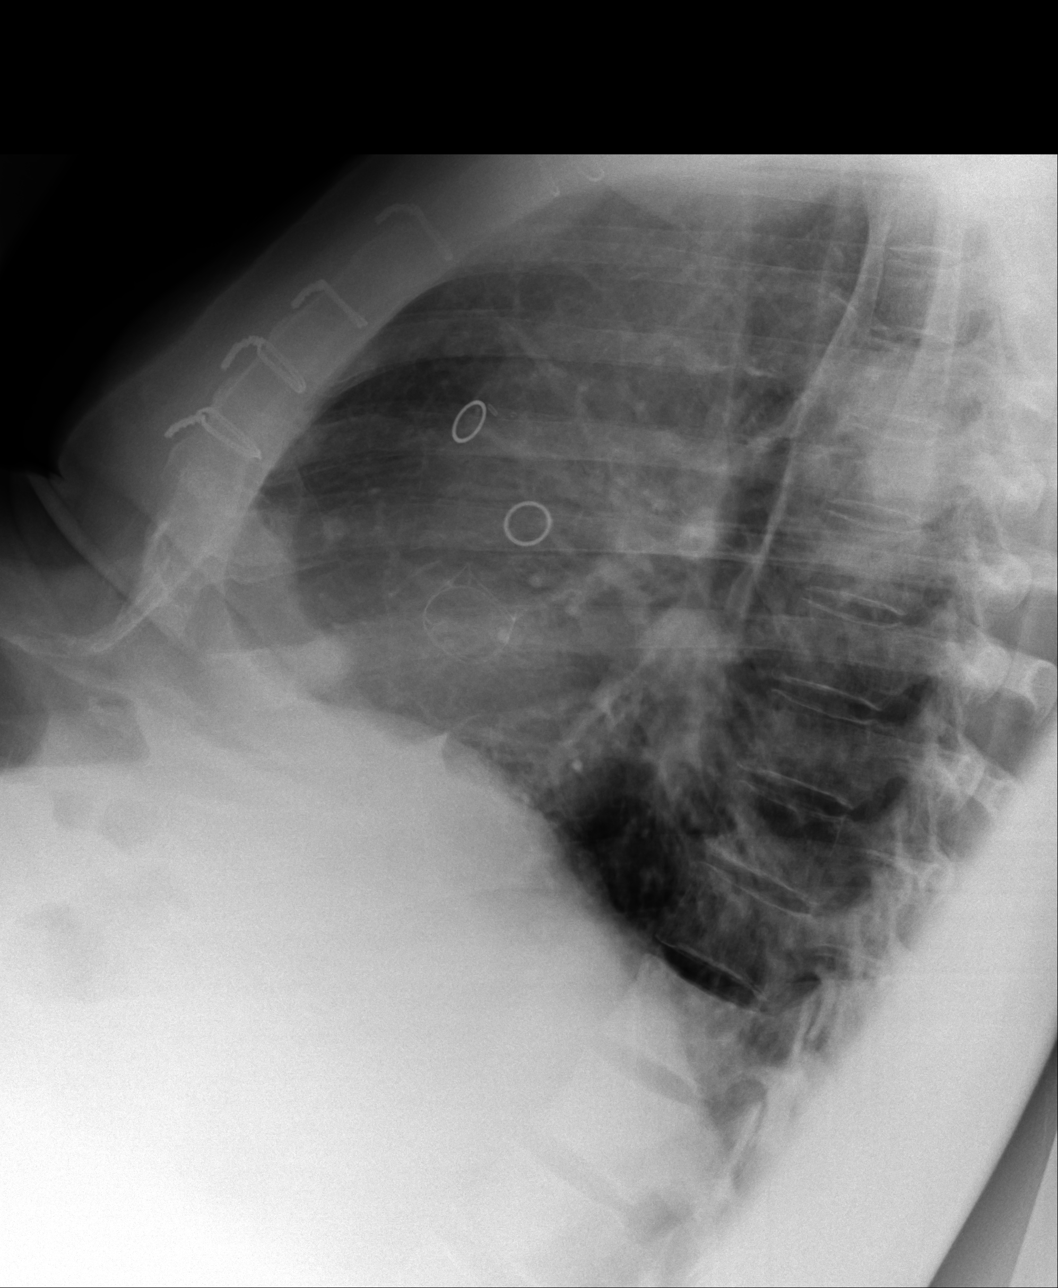

[3 of 3 positions shown; findings below may reference images not displayed]

FINDINGS: Normal heart size post CABG.

Minimally tortuous aorta.

Mediastinal contours and pulmonary vascularity normal.

Focal right upper lobe opacity approximately 4.9 cm in greatest
dimension, could represent tumor or infection.

Small nodules project over left lung measuring 6 and 7 mm diameter,
question pulmonary or osseous.

Questionable additional nodular density in lateral left lung base.

No additional infiltrate, pleural effusion or pneumothorax.

Nodular foci at the humeral heads bilaterally consistent with
sclerotic osseous metastases.
IMPRESSION: Osseous metastases.

Small pulmonary nodules versus osseous metastases left hemi thorax.

New 4.9 cm diameter right upper lobe opacity question infiltrate
versus mass.

## 2015-06-13 IMAGING — CR DG ABDOMEN 1V
1 series · 3 of 3 positions shown · non-contrast
Comparison: 01/28/2013 abdominal CT

CLINICAL DATA: Abdominal pain and swelling.

EXAM:
ABDOMEN - 1 VIEW

[Series 1: t abdomen supine · 0.14mm/px · 3 of 3 slices shown]
[im 1/3]
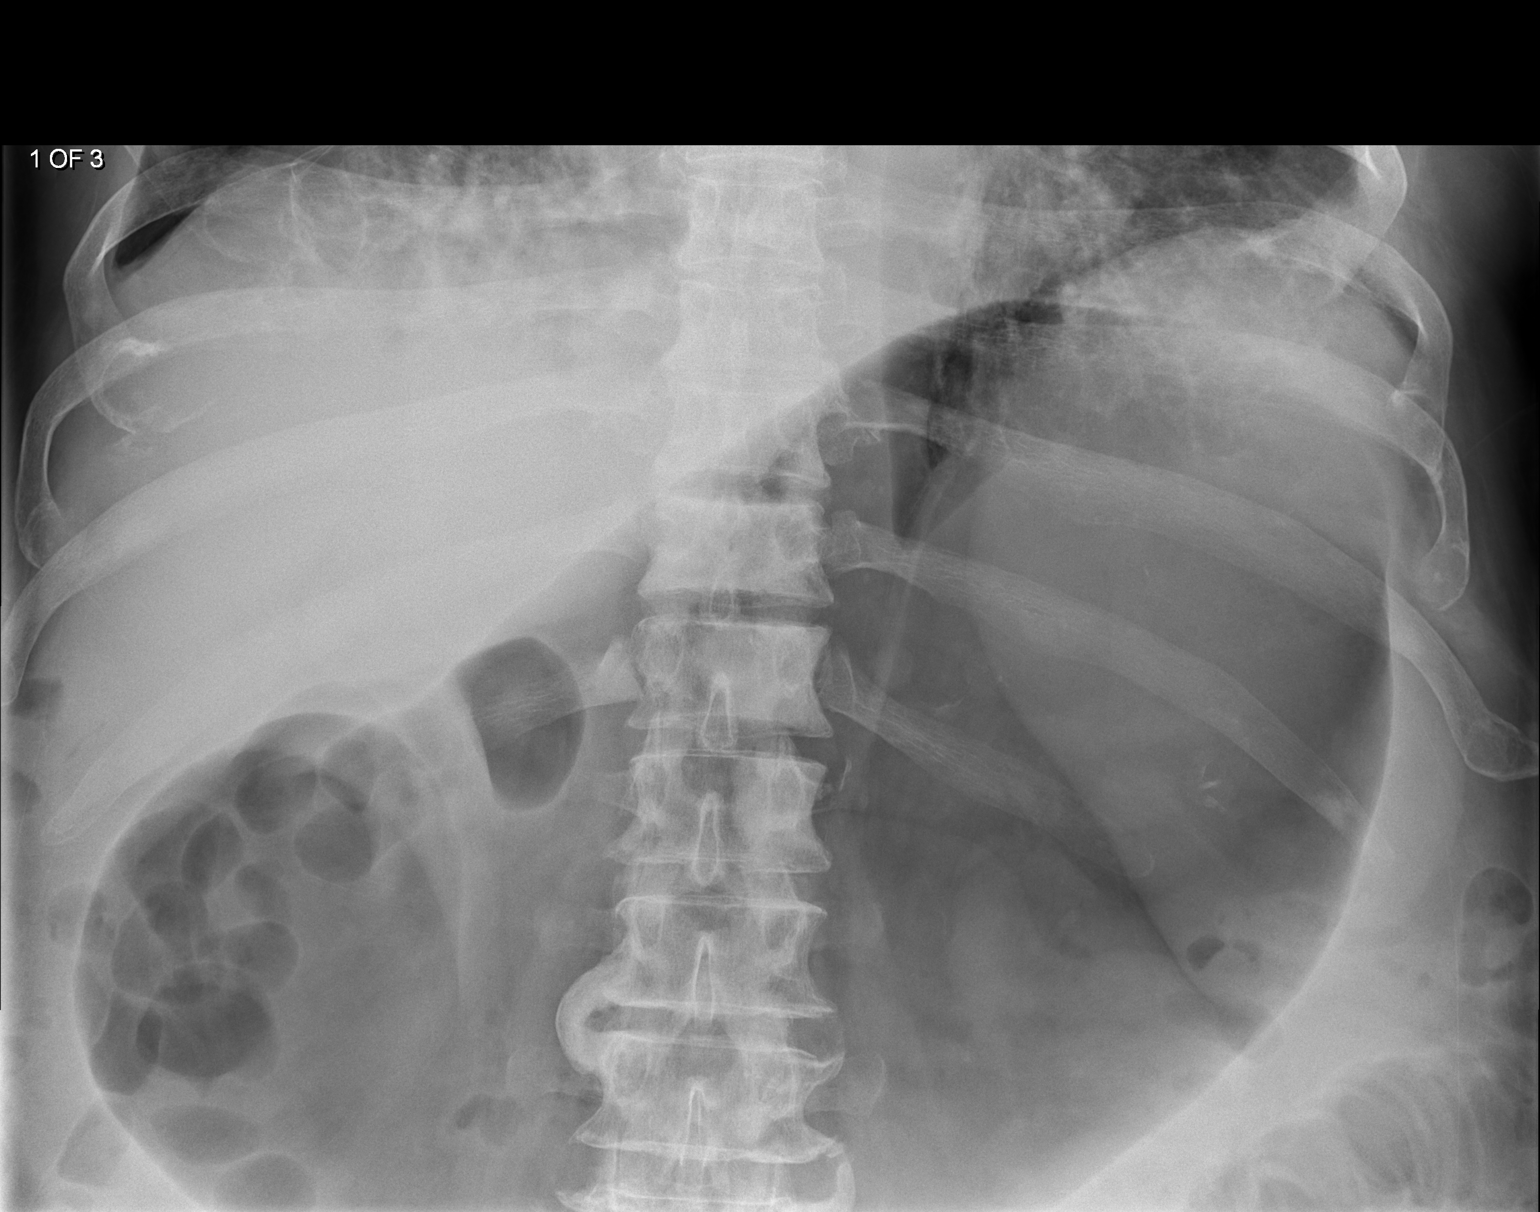
[im 2/3]
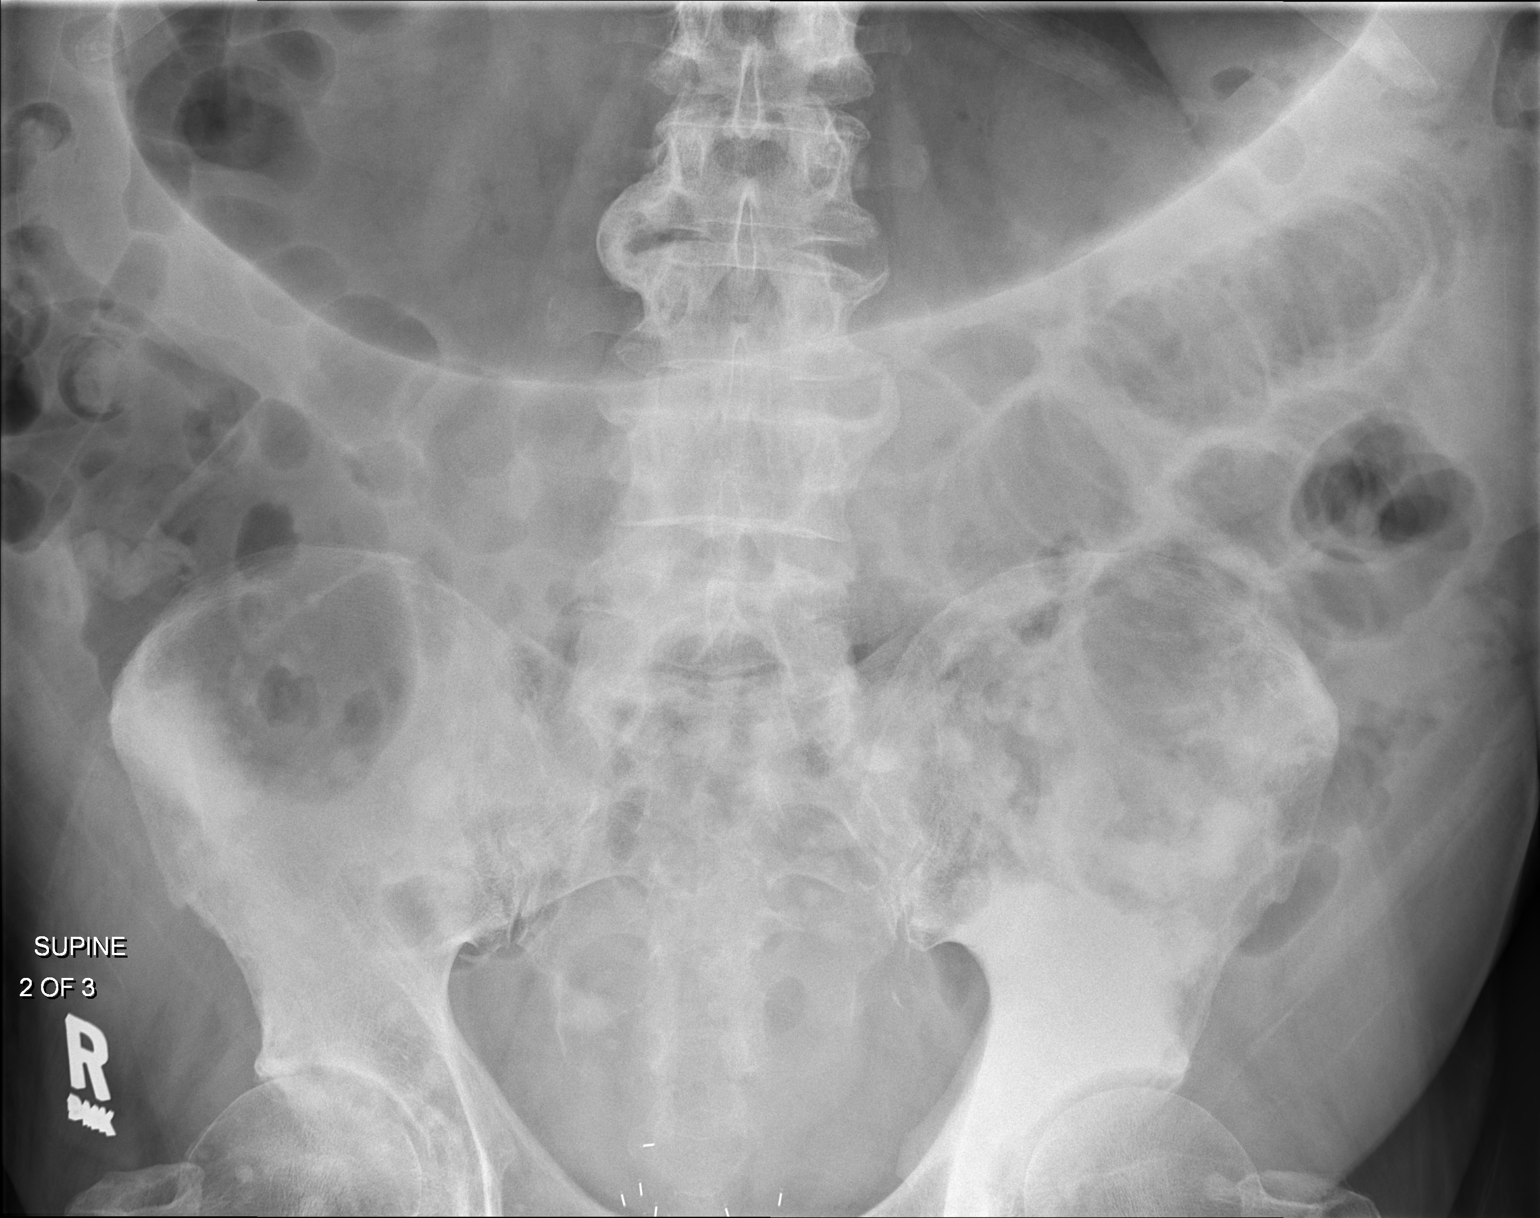
[im 3/3]
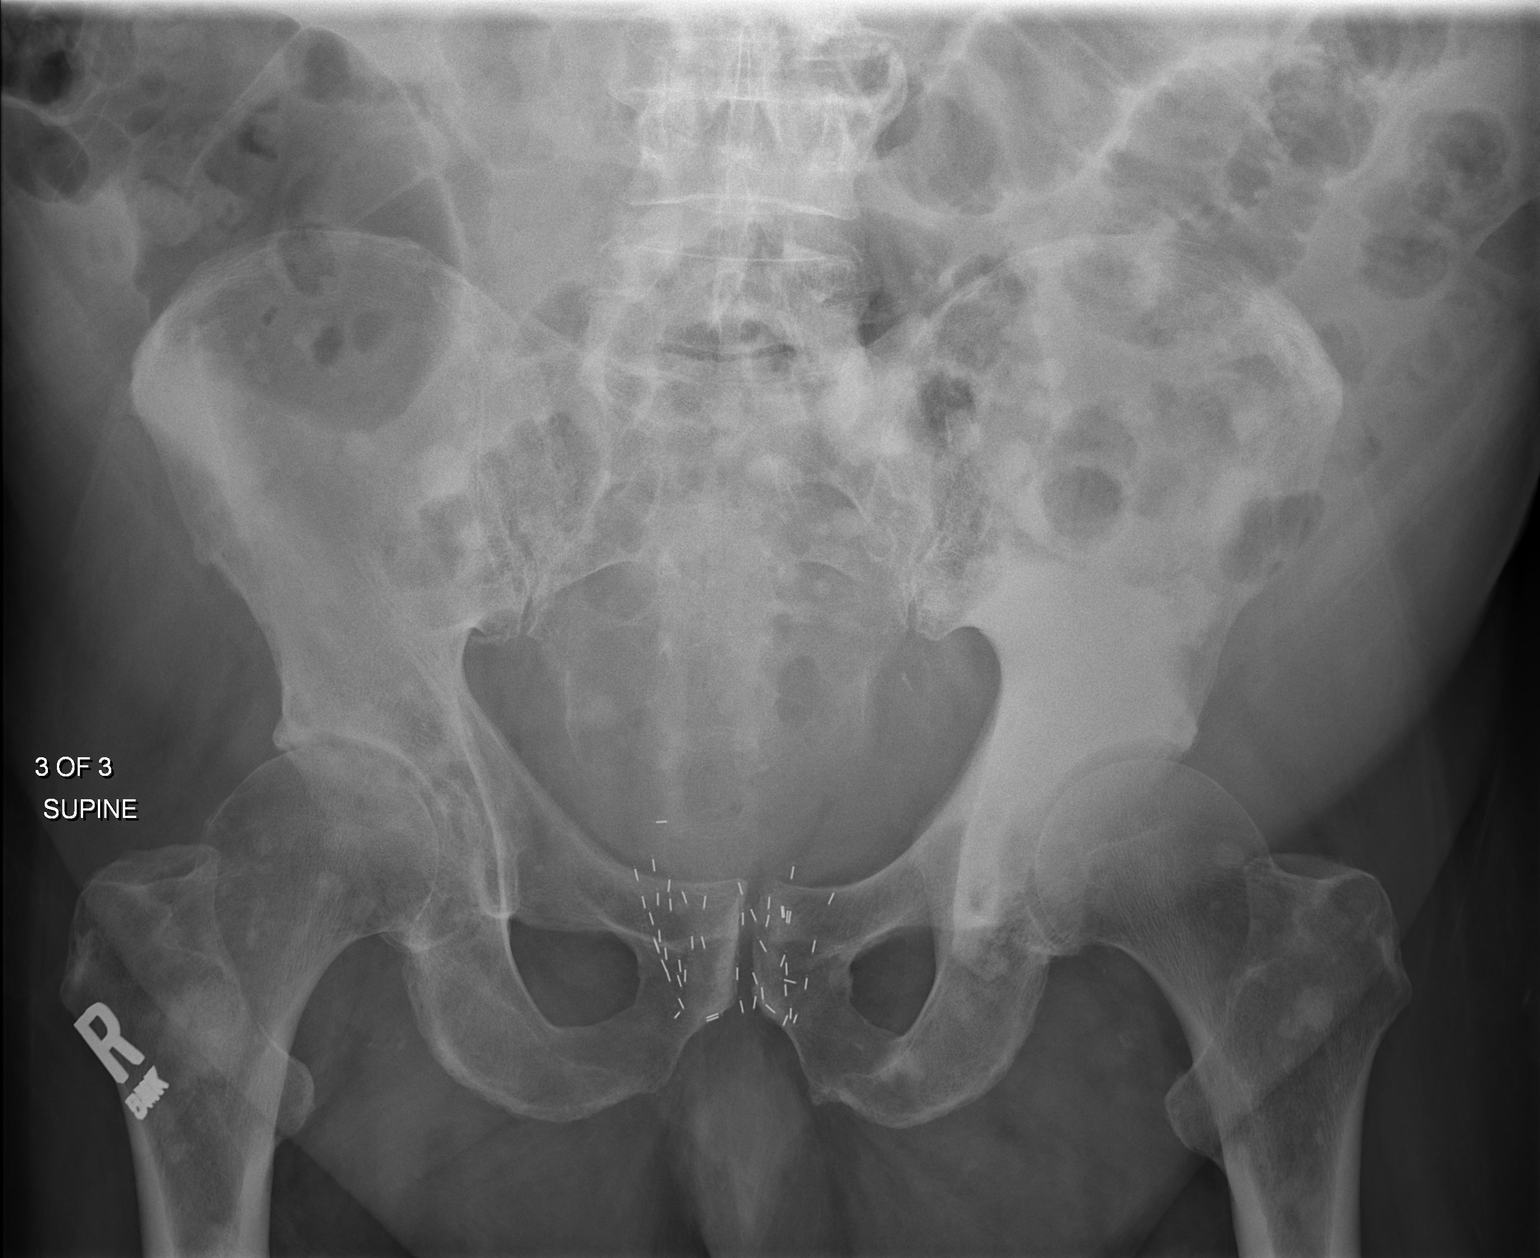

[3 of 3 positions shown; findings below may reference images not displayed]

FINDINGS: Abnormal bowel gas pattern. There is marked gaseous distention of
the stomach. Small bowel loops are dilated in the left lower
quadrant, up to 4.5 cm diameter. There is a relative paucity of
colonic gas, although gas is seen within the cecum.

Prostate brachytherapy seeds. Extensive sclerotic skeletal
metastases. Reticulation in the lower lungs is chronic and
consistent with scarring.
IMPRESSION: 1. Abnormal bowel gas pattern, primarily concerning for obstruction
of the distal small bowel or proximal colon.
2. Notable gaseous gastric distention.
3. Prostate cancer with osseous metastases.

## 2015-06-14 IMAGING — US US RENAL KIDNEY
1 series · 14 of 25 positions shown · non-contrast
Comparison: CT ABD-PELV W/O CM dated 02/17/2013

CLINICAL DATA: Acute renal failure.

EXAM:
RENAL/URINARY TRACT ULTRASOUND COMPLETE

[Series 1: us renal kidney · 0.36mm/px · 14 of 50 slices shown]
[im 1/50]
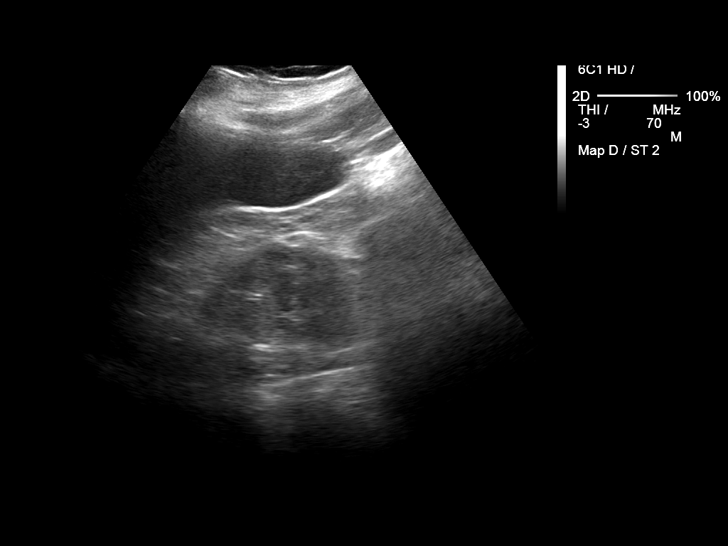
[im 5/50]
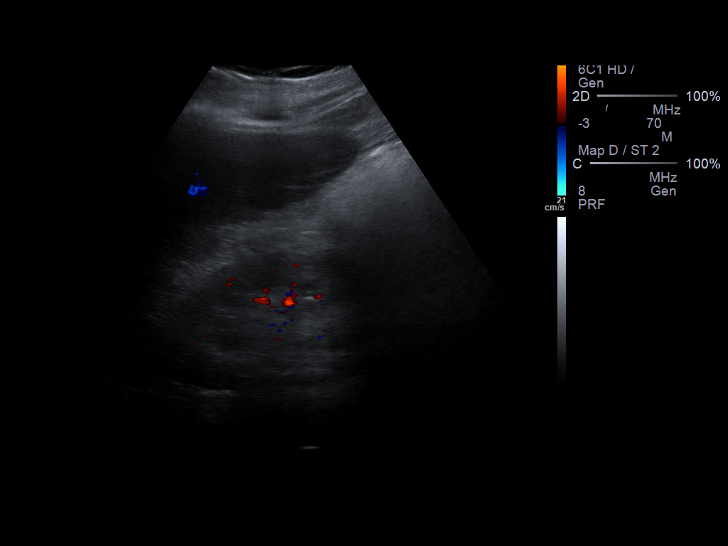
[im 9/50]
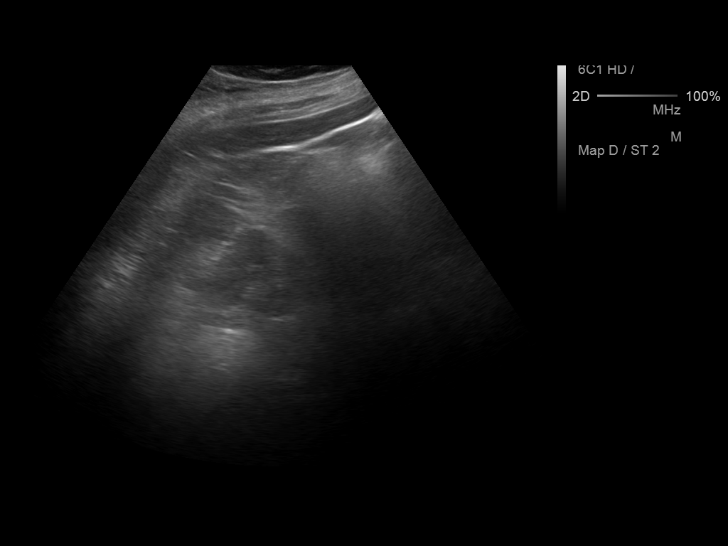
[im 13/50]
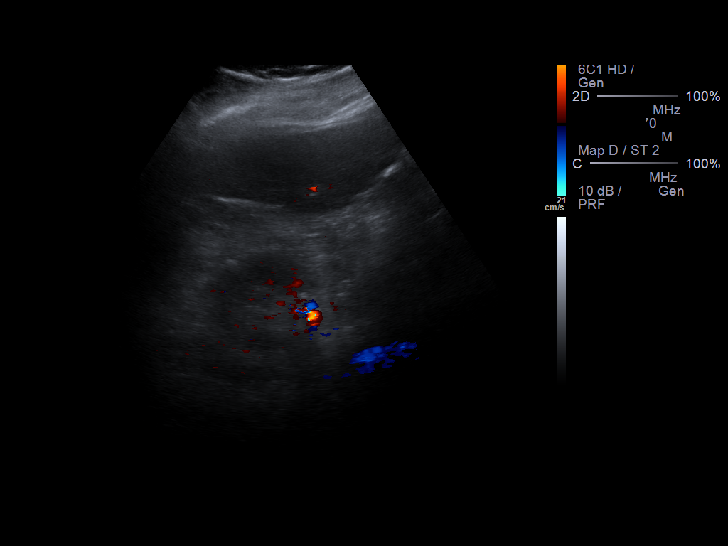
[im 17/50]
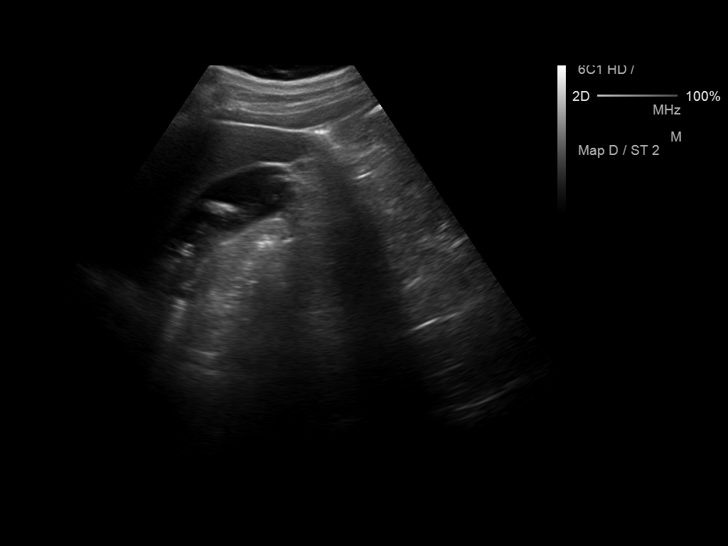
[im 19/50]
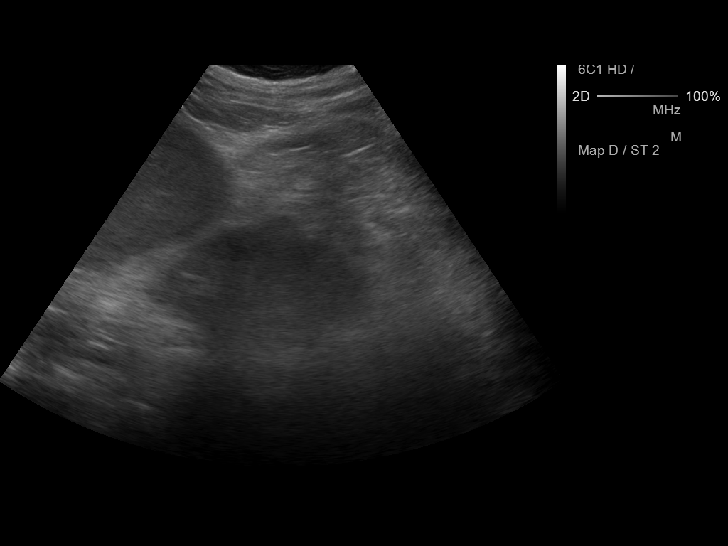
[im 23/50]
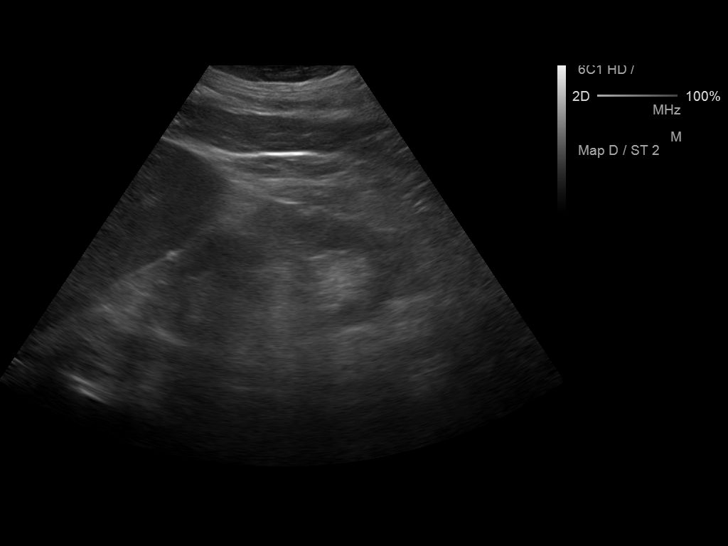
[im 27/50]
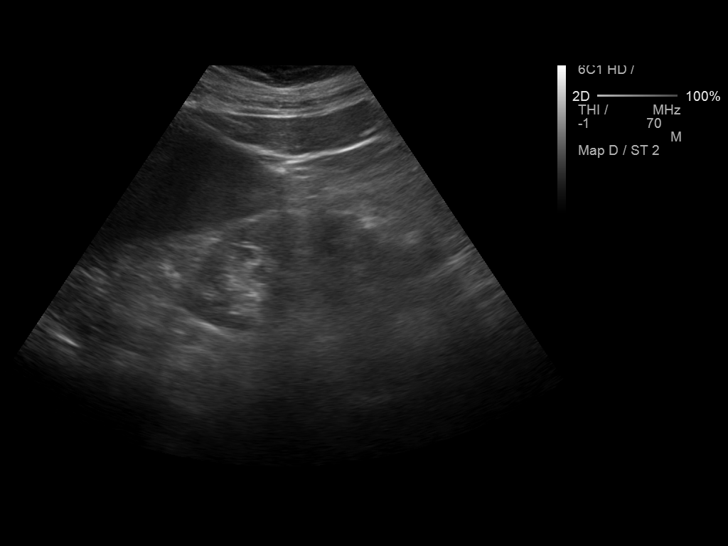
[im 31/50]
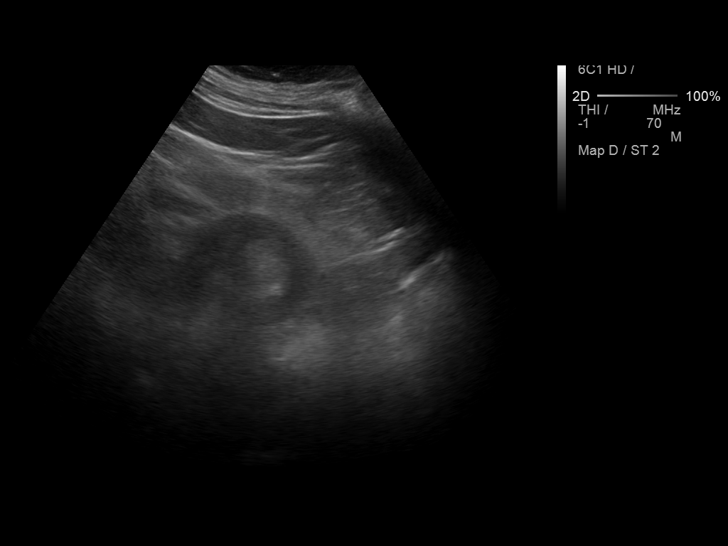
[im 33/50]
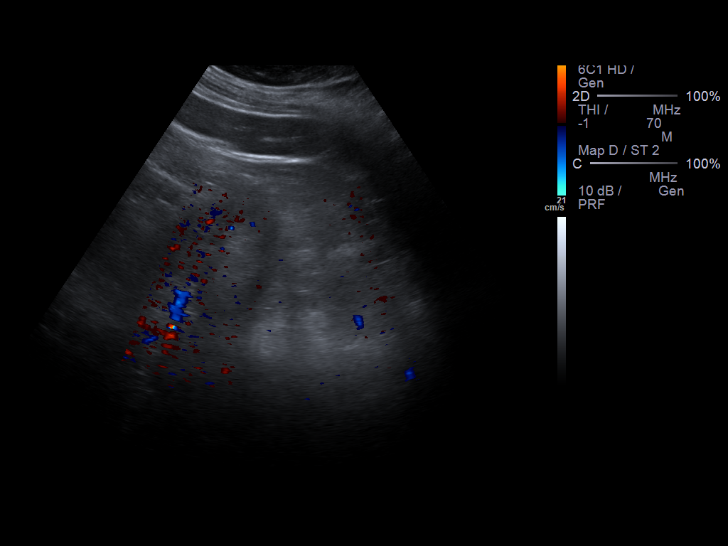
[im 37/50]
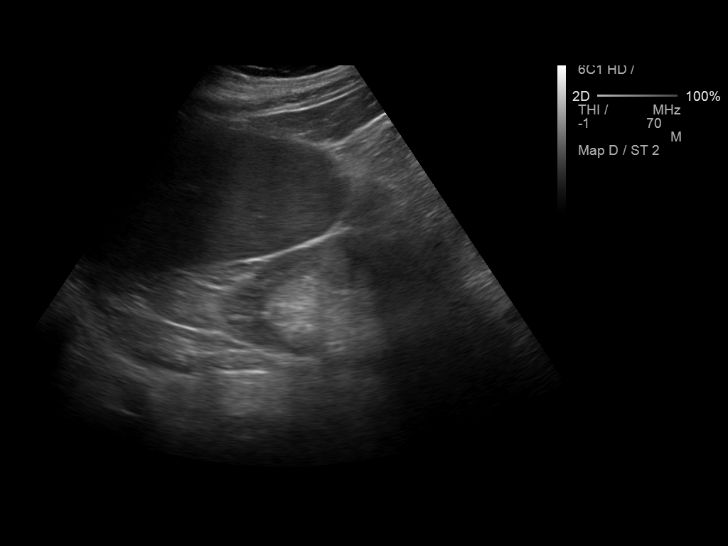
[im 41/50]
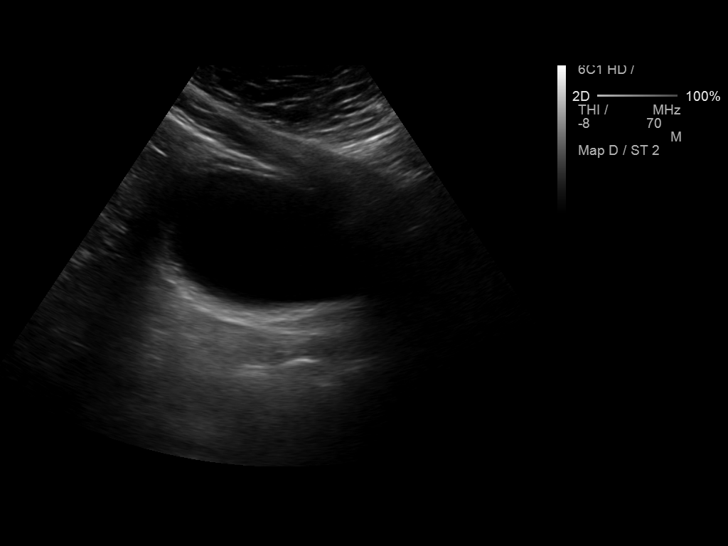
[im 45/50]
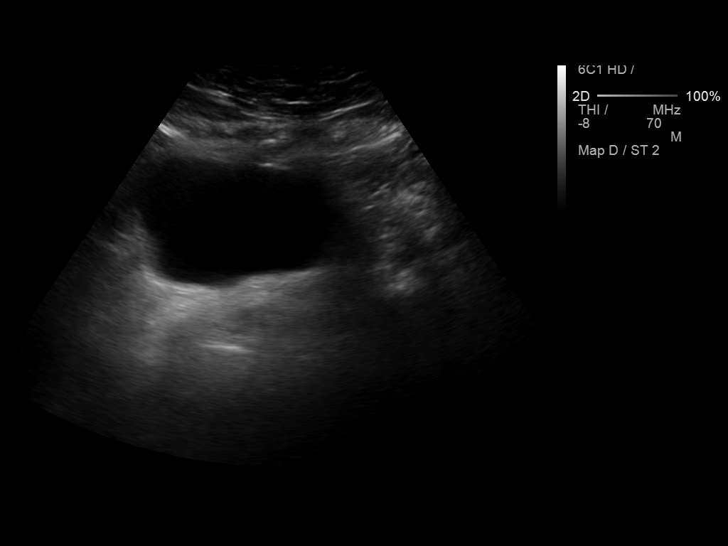
[im 50/50]
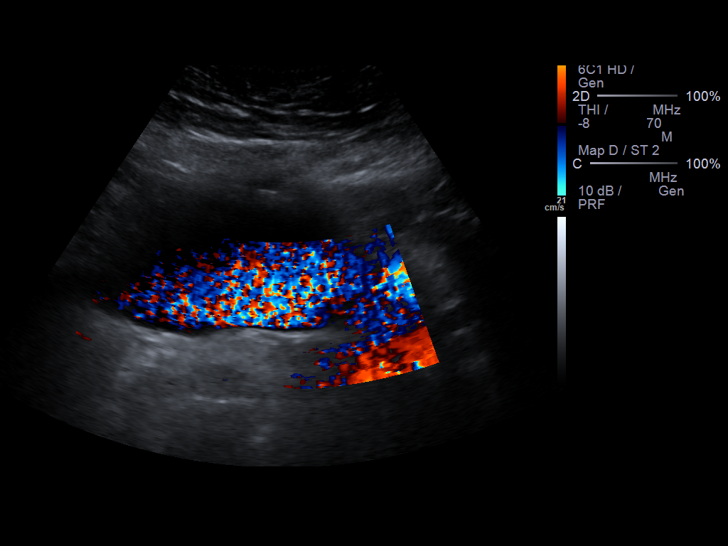

[14 of 25 positions shown; findings below may reference images not displayed]

FINDINGS: Right Kidney:

Length: 11.4 cm. Slight increased echogenicity. No hydronephrosis.
No mass.

Left Kidney:

Length: 12.4 cm.. Slight increased echogenicity. No hydronephrosis.
No mass.

Bladder:

Appears normal for degree of bladder distention.

This a difficult exam due to body habitus. Gallstones incidentally
noted.
IMPRESSION: 1. Slightly echogenic kidneys suggesting chronic medical renal
disease. No hydronephrosis. Bladder nondistended.
2. Gallstones incidentally noted.

## 2015-06-15 IMAGING — CR DG CHEST 1V PORT
1 series · 1 of 1 positions shown · non-contrast
Comparison: Chest radiograph February 12, 2013 and chest CT February 13, 2013

CLINICAL DATA: Respiratory failure

EXAM:
PORTABLE CHEST - 1 VIEW

[ap]
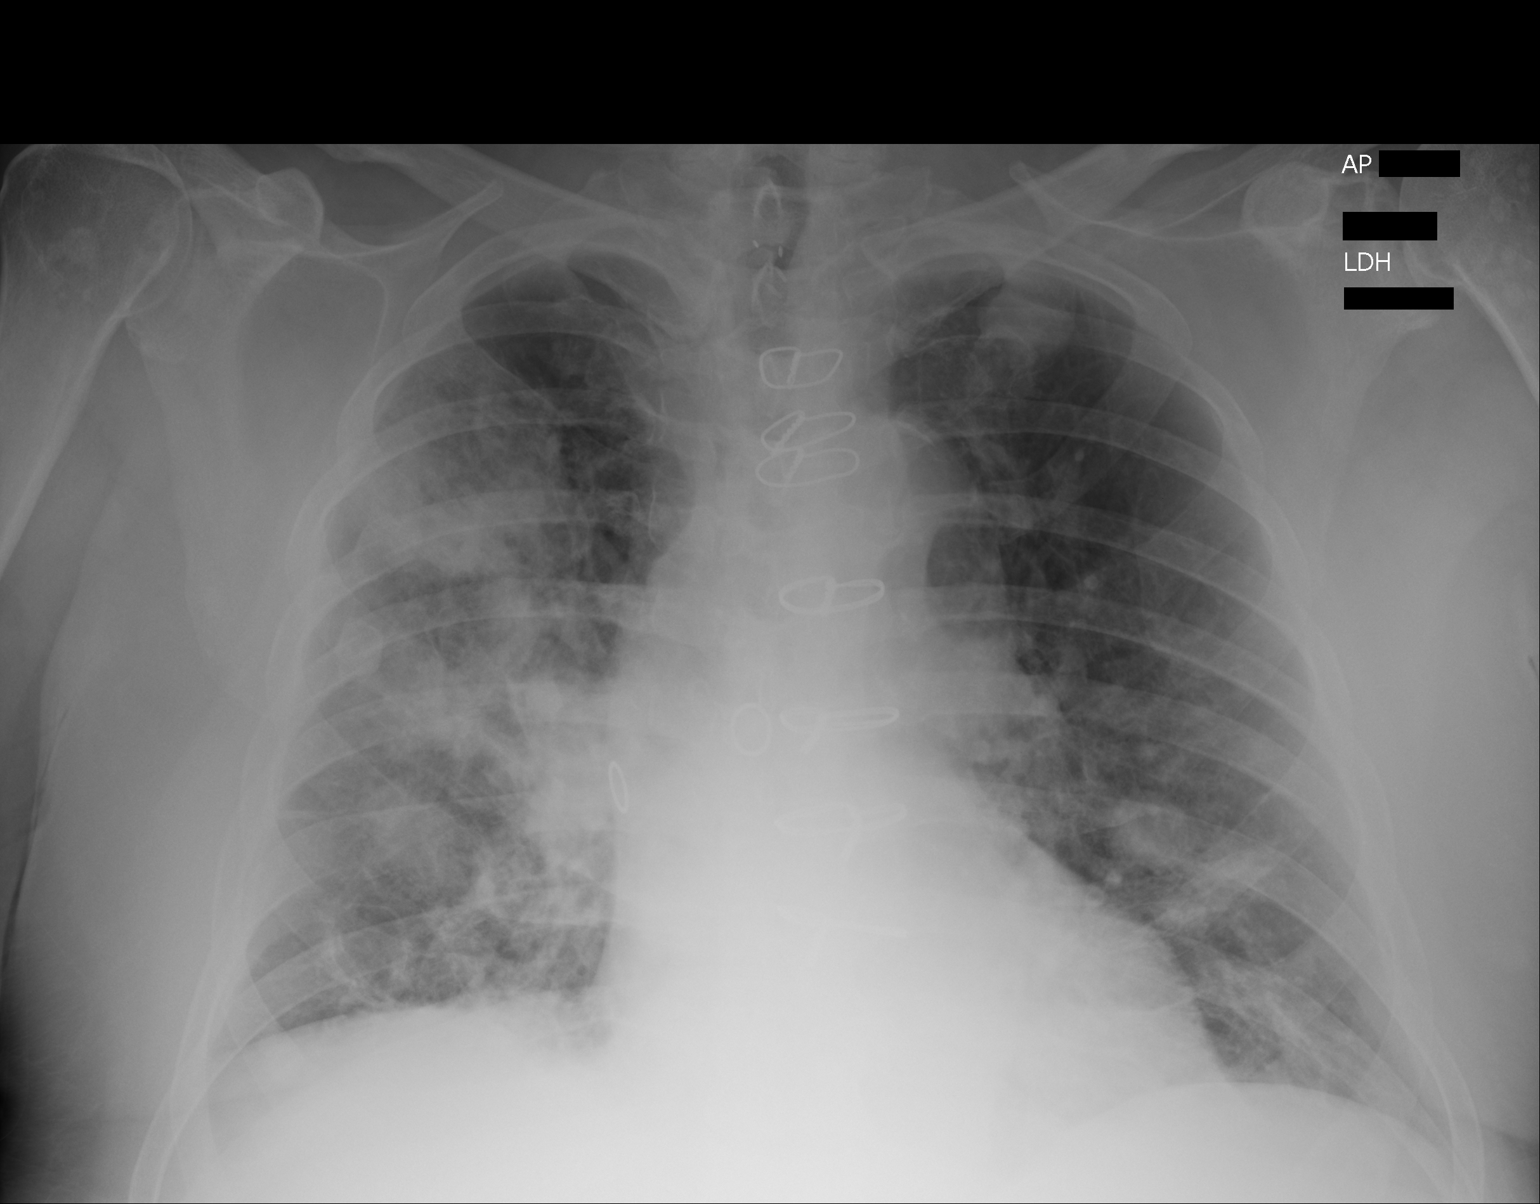

[1 of 1 positions shown; findings below may reference images not displayed]

FINDINGS: There remains extensive consolidation of the right lung, most
pronounced in the right upper lobe. There is patchy consolidation in
the left lower lobe. A nodular lesion in the left lower lobe which
contains calcification is again noted measuring 2.4 x 2.0 cm,
unchanged. There is no new infiltrate or mass. Heart size is upper
normal with normal pulmonary vascularity. No adenopathy. Patient is
status post coronary artery bypass grafting.
IMPRESSION: Extensive infiltrate on the right remains without change. Patchy
infiltrate in the left base with early nodular lesion in the left
lower lobe remain stable. No new lesion is identified compared to
recent prior studies.

## 2015-06-20 IMAGING — CR DG CHEST 2V
1 series · 2 of 2 positions shown · non-contrast
Comparison: 02/18/2013

CLINICAL DATA: Followup pneumonia

EXAM:
CHEST  2 VIEW

[Series 1: ap · 0.17mm/px · 2 of 2 slices shown]
[im 1/2]
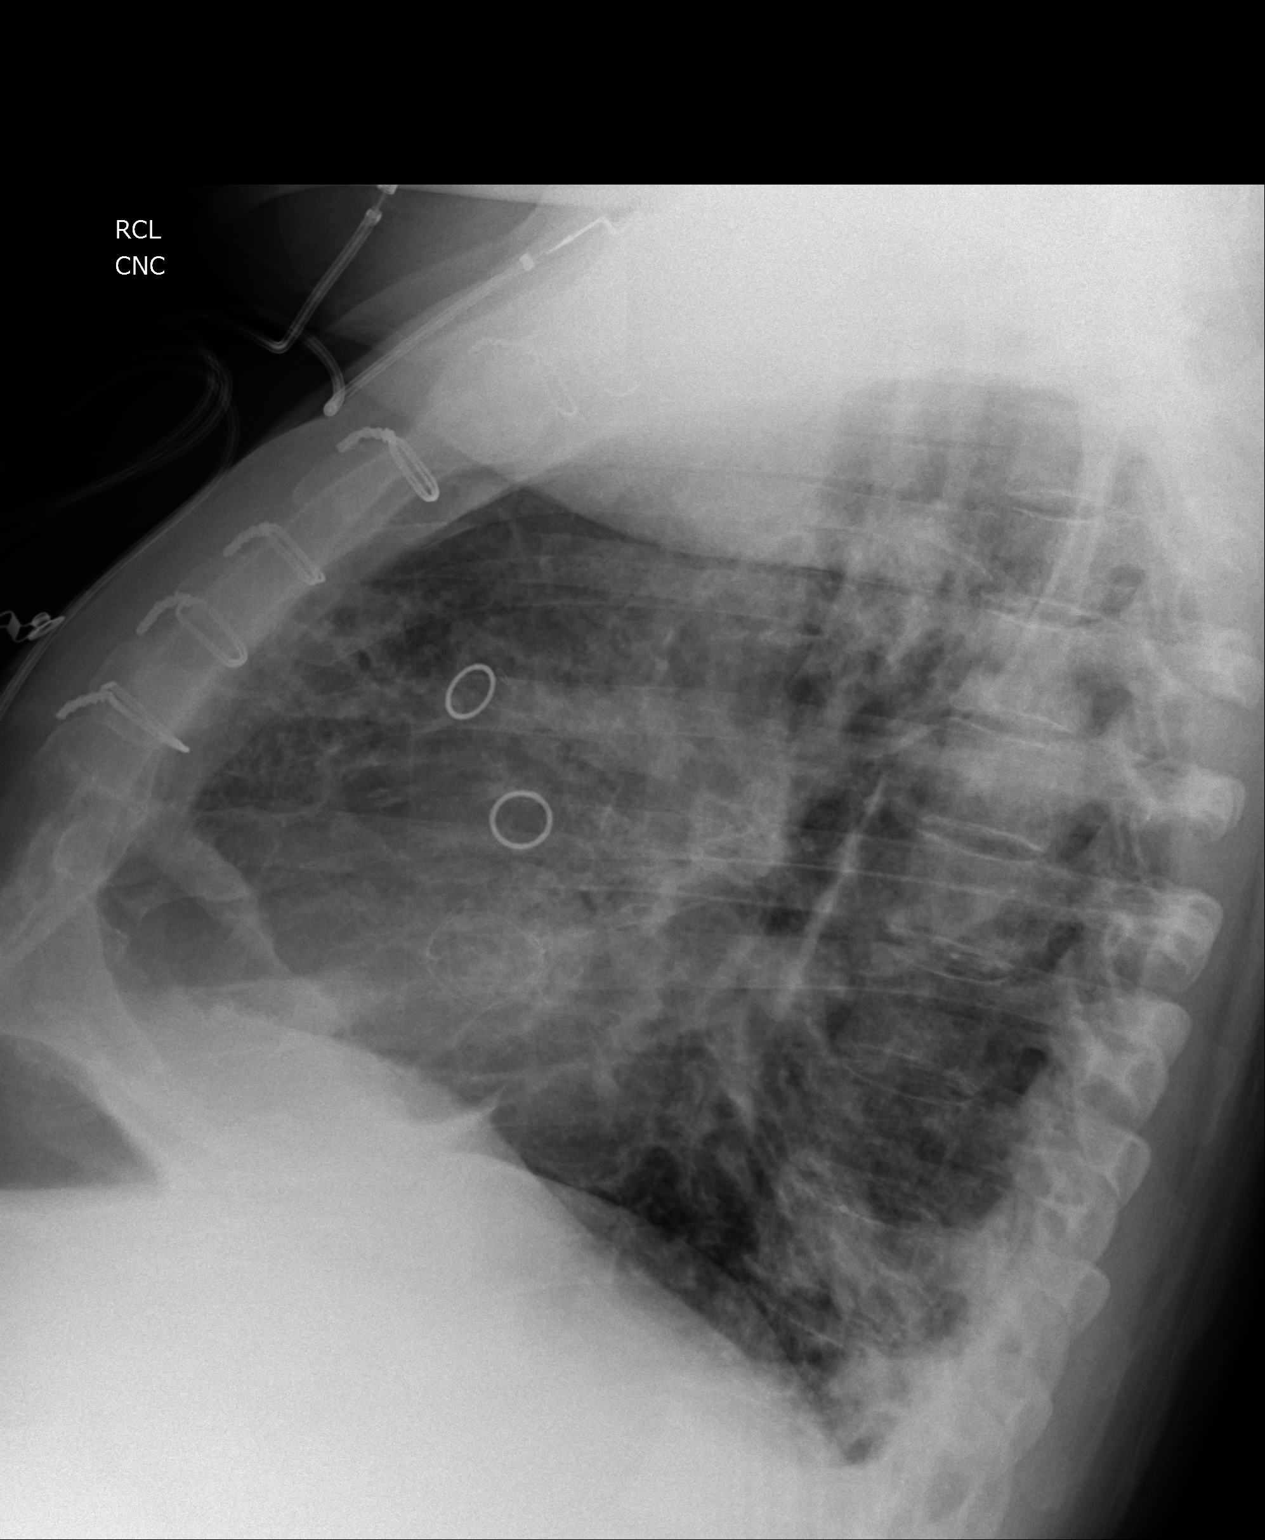
[im 2/2]
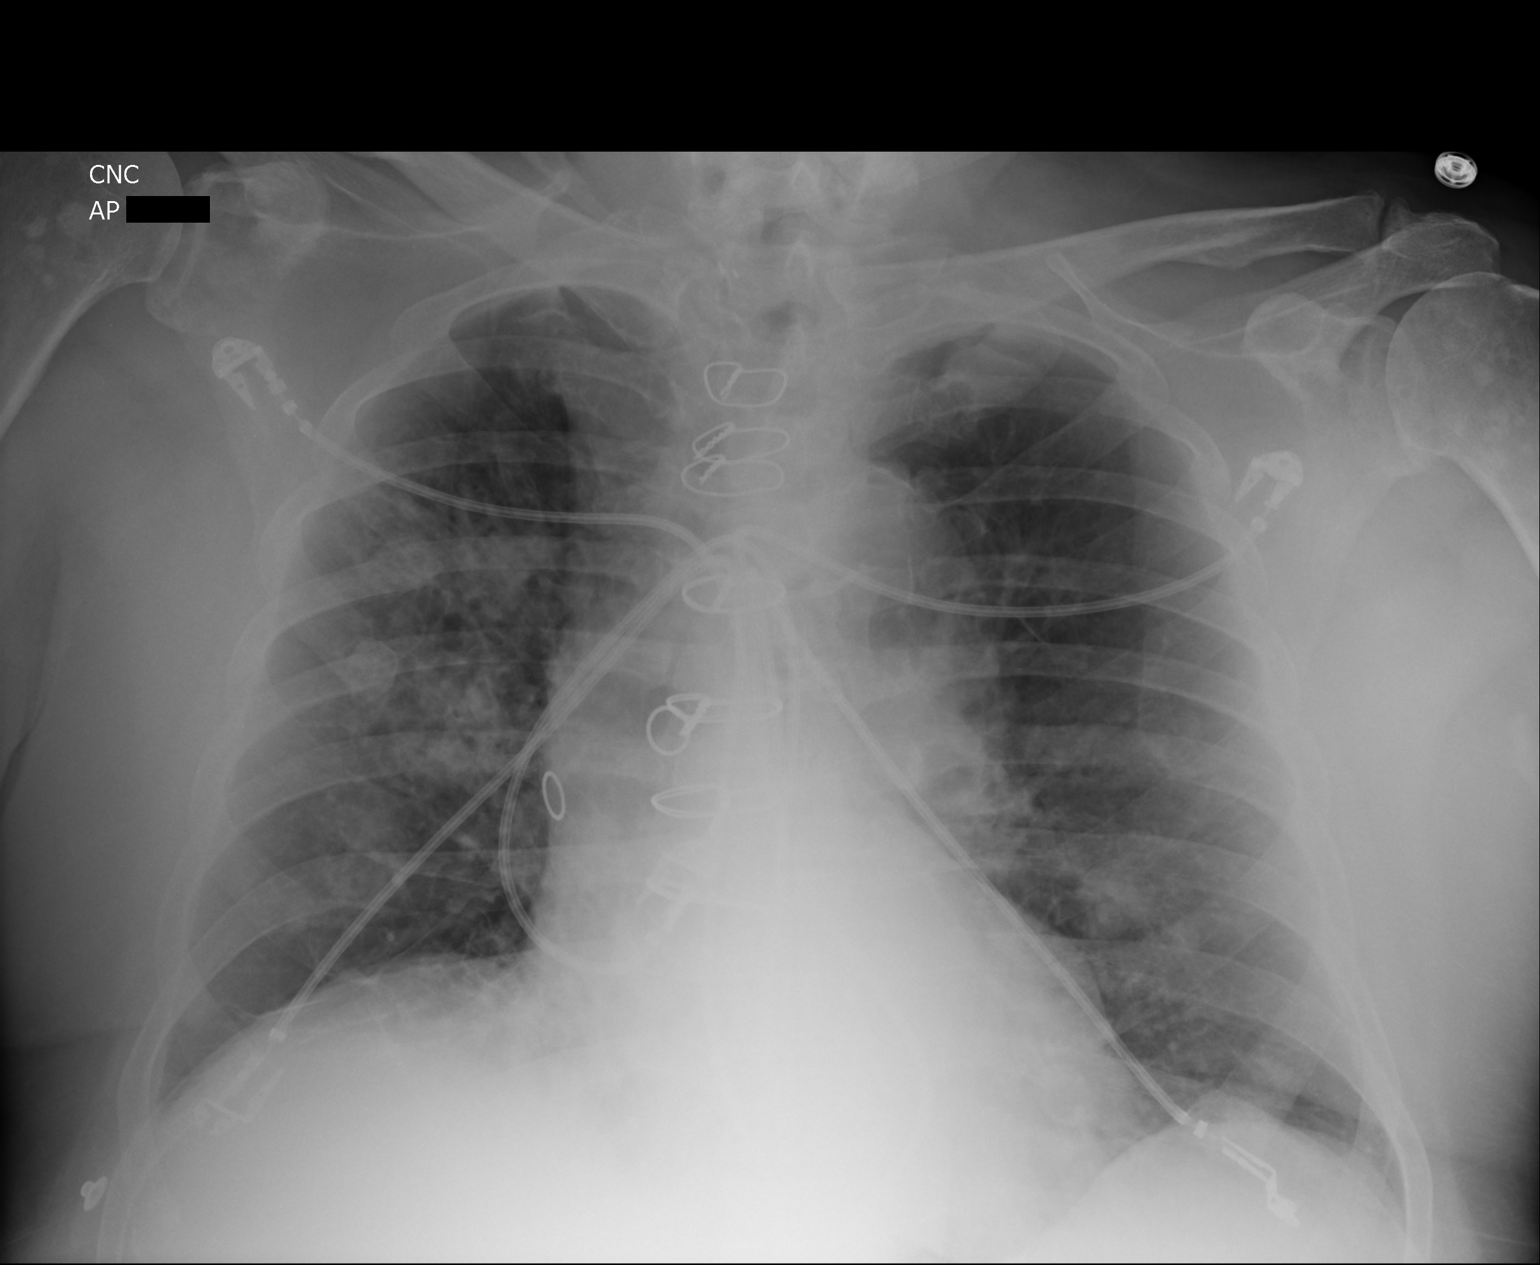

[2 of 2 positions shown; findings below may reference images not displayed]

FINDINGS: There is improvement in aeration in right upper lobe. Persistent
patchy residual nodular infiltrate right upper lobe. . Follow-up to
complete resolution is recommended. Persistent nodule in left lower
lobe. Multiple sclerotic bone metastasis again noted. No pulmonary
edema.
IMPRESSION: There is improvement in aeration in right upper lobe. Persistent
patchy residual nodular infiltrate right upper lobe. . Follow-up to
complete resolution is recommended. Persistent nodule in left lower
lobe. Multiple sclerotic bone metastasis again noted.
